# Patient Record
Sex: Female | Born: 1985 | Race: White | Hispanic: No | Marital: Married | Smoking: Former smoker
Health system: Southern US, Community
[De-identification: ages and names within clinical notes are randomized; demographics above are authoritative.]

## PROBLEM LIST (undated history)

## (undated) DIAGNOSIS — K219 Gastro-esophageal reflux disease without esophagitis: Secondary | ICD-10-CM

## (undated) DIAGNOSIS — J45909 Unspecified asthma, uncomplicated: Secondary | ICD-10-CM

## (undated) DIAGNOSIS — A6000 Herpesviral infection of urogenital system, unspecified: Secondary | ICD-10-CM

## (undated) DIAGNOSIS — E669 Obesity, unspecified: Secondary | ICD-10-CM

## (undated) DIAGNOSIS — F4310 Post-traumatic stress disorder, unspecified: Secondary | ICD-10-CM

## (undated) DIAGNOSIS — T753XXA Motion sickness, initial encounter: Secondary | ICD-10-CM

## (undated) DIAGNOSIS — F1910 Other psychoactive substance abuse, uncomplicated: Secondary | ICD-10-CM

## (undated) DIAGNOSIS — O24419 Gestational diabetes mellitus in pregnancy, unspecified control: Secondary | ICD-10-CM

## (undated) DIAGNOSIS — F419 Anxiety disorder, unspecified: Secondary | ICD-10-CM

## (undated) DIAGNOSIS — F329 Major depressive disorder, single episode, unspecified: Secondary | ICD-10-CM

## (undated) DIAGNOSIS — E739 Lactose intolerance, unspecified: Secondary | ICD-10-CM

## (undated) DIAGNOSIS — Z8742 Personal history of other diseases of the female genital tract: Secondary | ICD-10-CM

## (undated) DIAGNOSIS — F32A Depression, unspecified: Secondary | ICD-10-CM

## (undated) DIAGNOSIS — K644 Residual hemorrhoidal skin tags: Secondary | ICD-10-CM

## (undated) DIAGNOSIS — R0600 Dyspnea, unspecified: Secondary | ICD-10-CM

## (undated) HISTORY — DX: Post-traumatic stress disorder, unspecified: F43.10

## (undated) HISTORY — DX: Anxiety disorder, unspecified: F41.9

## (undated) HISTORY — DX: Depression, unspecified: F32.A

## (undated) HISTORY — DX: Herpesviral infection of urogenital system, unspecified: A60.00

## (undated) HISTORY — DX: Major depressive disorder, single episode, unspecified: F32.9

## (undated) HISTORY — DX: Obesity, unspecified: E66.9

## (undated) HISTORY — PX: NO PAST SURGERIES: SHX2092

## (undated) HISTORY — DX: Personal history of other diseases of the female genital tract: Z87.42

## (undated) HISTORY — DX: Unspecified asthma, uncomplicated: J45.909

## (undated) HISTORY — DX: Lactose intolerance, unspecified: E73.9

---

## 2004-11-12 ENCOUNTER — Ambulatory Visit: Payer: Self-pay | Admitting: Unknown Physician Specialty

## 2005-03-01 ENCOUNTER — Emergency Department: Payer: Self-pay | Admitting: Internal Medicine

## 2006-03-29 ENCOUNTER — Observation Stay: Payer: Self-pay | Admitting: Obstetrics & Gynecology

## 2006-05-14 ENCOUNTER — Inpatient Hospital Stay: Payer: Self-pay | Admitting: Obstetrics & Gynecology

## 2006-05-14 ENCOUNTER — Observation Stay: Payer: Self-pay | Admitting: Unknown Physician Specialty

## 2008-12-28 ENCOUNTER — Emergency Department: Payer: Self-pay | Admitting: Emergency Medicine

## 2013-05-28 ENCOUNTER — Emergency Department: Payer: Self-pay | Admitting: Emergency Medicine

## 2013-08-15 ENCOUNTER — Emergency Department: Payer: Self-pay | Admitting: Emergency Medicine

## 2013-08-15 LAB — COMPREHENSIVE METABOLIC PANEL
Alkaline Phosphatase: 97 U/L (ref 50–136)
Chloride: 106 mmol/L (ref 98–107)
Creatinine: 0.72 mg/dL (ref 0.60–1.30)
Glucose: 114 mg/dL — ABNORMAL HIGH (ref 65–99)
Osmolality: 272 (ref 275–301)
SGPT (ALT): 31 U/L (ref 12–78)
Sodium: 136 mmol/L (ref 136–145)
Total Protein: 8 g/dL (ref 6.4–8.2)

## 2013-08-15 LAB — CBC
HCT: 44.1 % (ref 35.0–47.0)
MCHC: 34.6 g/dL (ref 32.0–36.0)
Platelet: 269 10*3/uL (ref 150–440)
RBC: 5.2 10*6/uL (ref 3.80–5.20)
RDW: 13.2 % (ref 11.5–14.5)

## 2013-08-15 LAB — URINALYSIS, COMPLETE
Glucose,UR: NEGATIVE mg/dL (ref 0–75)
Ketone: NEGATIVE
Nitrite: NEGATIVE
Ph: 5 (ref 4.5–8.0)
Protein: NEGATIVE
RBC,UR: 4 /HPF (ref 0–5)
Specific Gravity: 1.024 (ref 1.003–1.030)
Squamous Epithelial: 4
WBC UR: 5 /HPF (ref 0–5)

## 2013-08-15 LAB — LIPASE, BLOOD: Lipase: 109 U/L (ref 73–393)

## 2014-02-22 IMAGING — CR RIGHT HIP - COMPLETE 2+ VIEW
1 series · 2 of 2 positions shown · non-contrast
Comparison: none

REASON FOR EXAM: trauma
COMMENTS:   May transport without cardiac monitor

PROCEDURE:     DXR - DXR HIP RIGHT COMPLETE  - May 28, 2013  [DATE]
RESULT:     AP and frog-leg lateral views of the right hip reveal no
evidence of fracture nor dislocation. No significant degenerative change is
demonstrated. The observed portions of the right hemipelvis appear normal.

[Series 2: t hip ap right · 0.14mm/px · 2 of 2 slices shown]
[im 1/2]
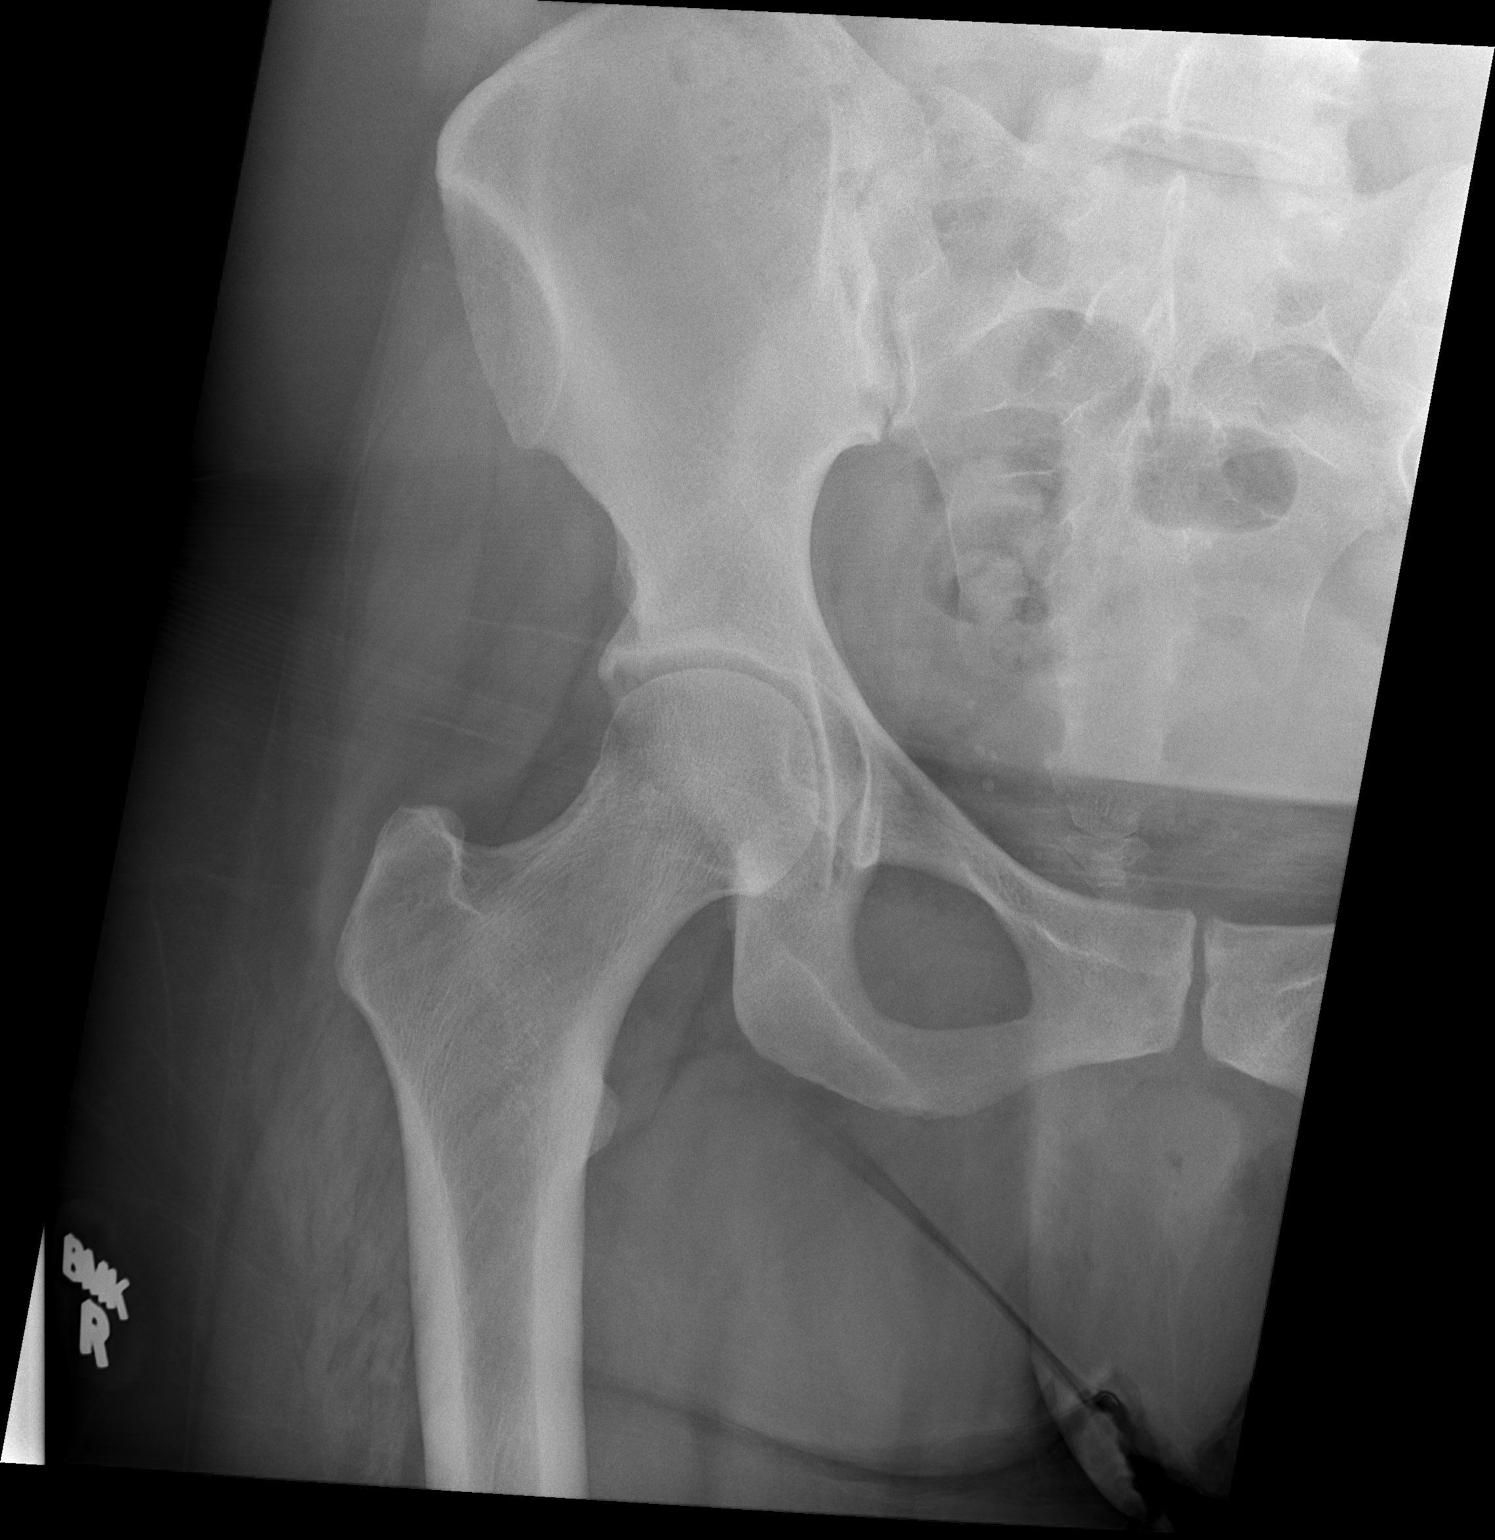
[im 2/2]
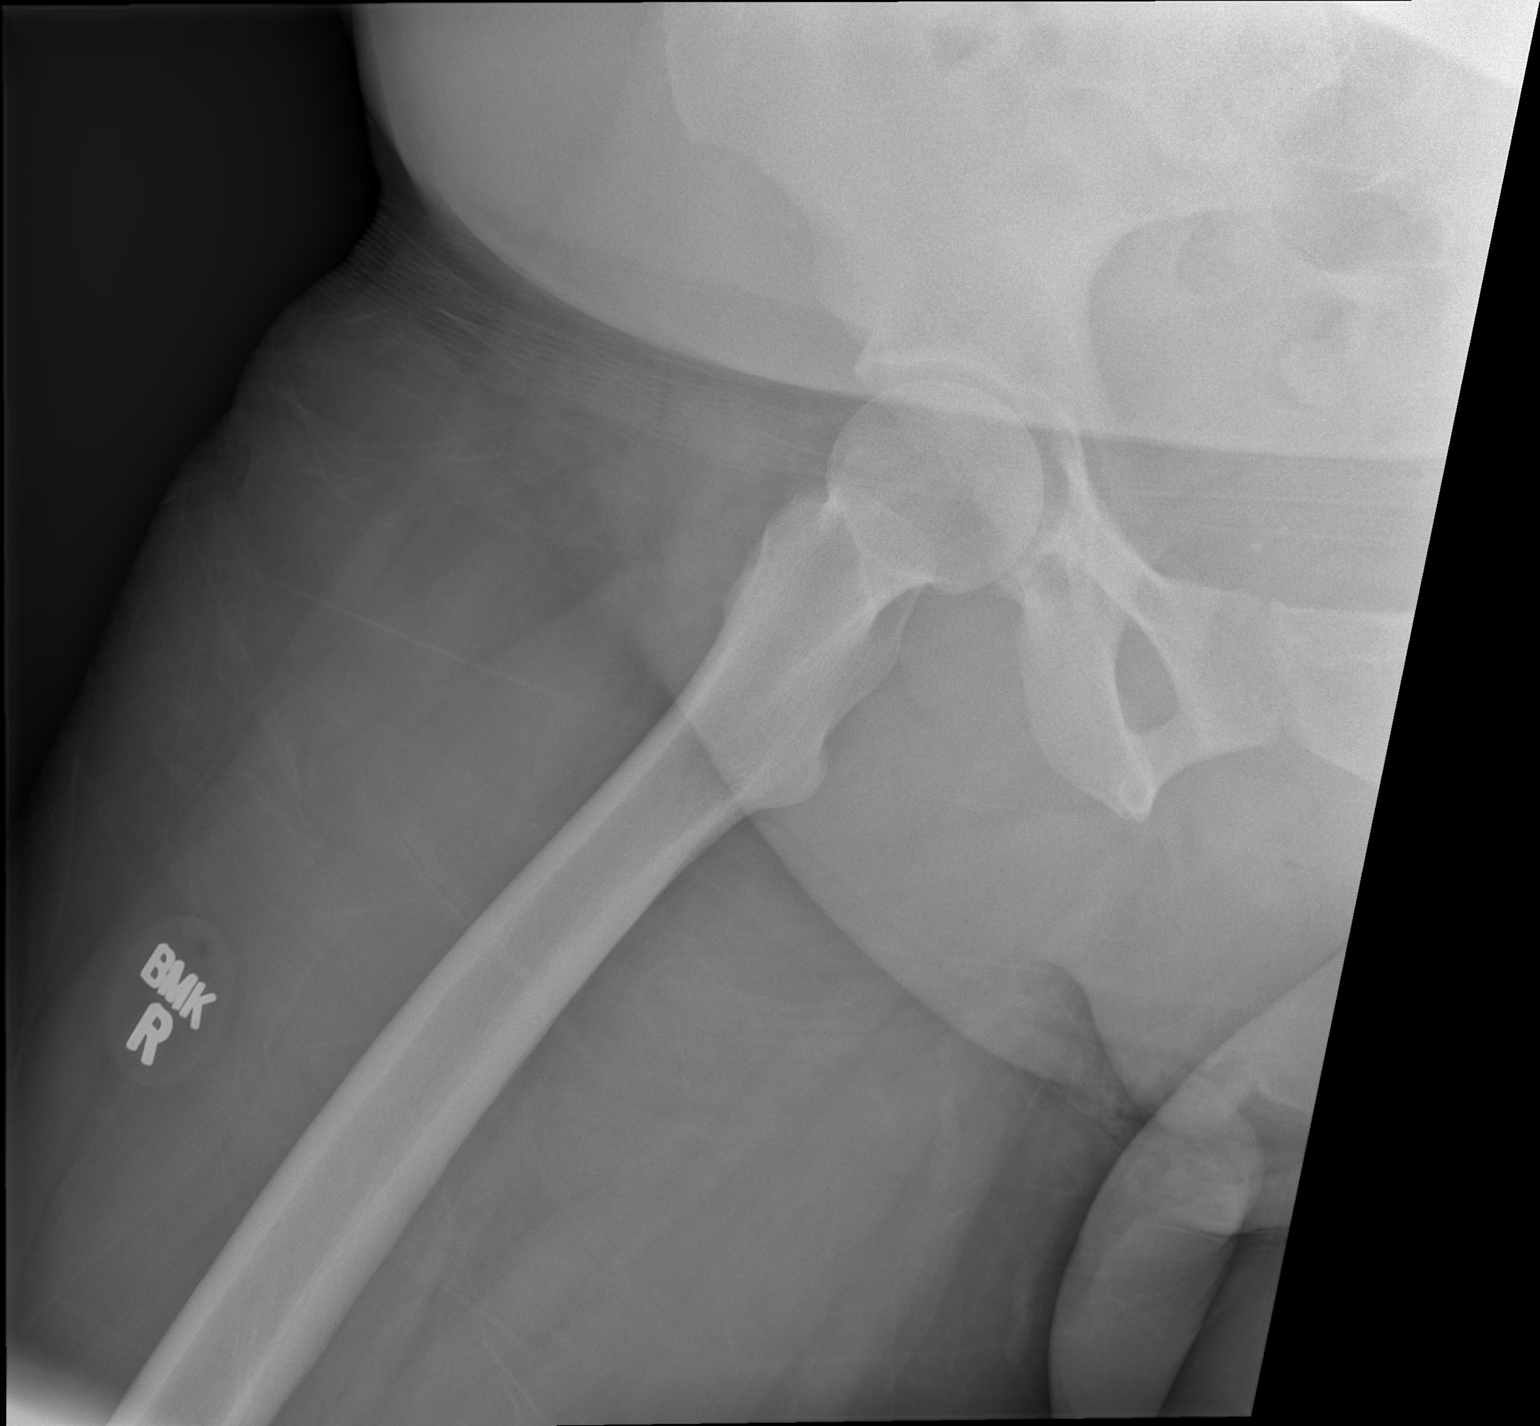

[2 of 2 positions shown; findings below may reference images not displayed]

IMPRESSION: There is no acute bony abnormality of the right hip.

[REDACTED]

## 2016-10-06 NOTE — L&D Delivery Note (Signed)
Delivery Note At 9:07 PM a viable female was delivered via Vaginal, Spontaneous Delivery (Presentation: ;  ).  APGAR: 8, 9; weight 8 lb 1.5 oz (3670 g).   Placenta status: spontaneous, intact.  Cord: 3VC, nuchal x 1  without complications: .  Cord pH: N/A  Anesthesia:  Epidural Episiotomy: None Lacerations: 1st degree Suture Repair: 3.0 monocryl Est. Blood Loss (mL):  300mL  Mom to postpartum.  Baby to Couplet care / Skin to Skin.  Carolyn Rosales 08/07/2017, 9:37 PM

## 2016-12-28 LAB — OB RESULTS CONSOLE TSH: TSH: 3.41

## 2016-12-28 LAB — OB RESULTS CONSOLE HEPATITIS B SURFACE ANTIGEN: Hepatitis B Surface Ag: NEGATIVE

## 2016-12-28 LAB — OB RESULTS CONSOLE GC/CHLAMYDIA
Chlamydia: NEGATIVE
GC PROBE AMP, GENITAL: NEGATIVE

## 2016-12-28 LAB — OB RESULTS CONSOLE ABO/RH: RH Type: POSITIVE

## 2016-12-28 LAB — OB RESULTS CONSOLE HIV ANTIBODY (ROUTINE TESTING): HIV: NONREACTIVE

## 2016-12-28 LAB — OB RESULTS CONSOLE RPR: RPR: NONREACTIVE

## 2017-03-20 ENCOUNTER — Telehealth: Payer: Self-pay | Admitting: Obstetrics & Gynecology

## 2017-03-20 NOTE — Telephone Encounter (Signed)
Called and left voicemail for patient to call back. We have received medical records from Budd PalmerBrandon M Lingenfelter PHD with Patient ob Records. Please schedule when patient returns call.

## 2017-03-23 NOTE — Telephone Encounter (Signed)
Left voicemail for pt to call back to being schedule

## 2017-04-09 ENCOUNTER — Other Ambulatory Visit: Payer: Self-pay | Admitting: Obstetrics and Gynecology

## 2017-04-09 ENCOUNTER — Ambulatory Visit (INDEPENDENT_AMBULATORY_CARE_PROVIDER_SITE_OTHER): Payer: Medicaid Other | Admitting: Certified Nurse Midwife

## 2017-04-09 ENCOUNTER — Encounter: Payer: Self-pay | Admitting: Certified Nurse Midwife

## 2017-04-09 VITALS — BP 122/62 | HR 96 | Ht 66.25 in | Wt 272.0 lb

## 2017-04-09 DIAGNOSIS — O0990 Supervision of high risk pregnancy, unspecified, unspecified trimester: Secondary | ICD-10-CM | POA: Insufficient documentation

## 2017-04-09 DIAGNOSIS — Z3A21 21 weeks gestation of pregnancy: Secondary | ICD-10-CM

## 2017-04-09 DIAGNOSIS — F419 Anxiety disorder, unspecified: Secondary | ICD-10-CM

## 2017-04-09 DIAGNOSIS — Z363 Encounter for antenatal screening for malformations: Secondary | ICD-10-CM

## 2017-04-09 DIAGNOSIS — F1290 Cannabis use, unspecified, uncomplicated: Secondary | ICD-10-CM

## 2017-04-09 DIAGNOSIS — O99342 Other mental disorders complicating pregnancy, second trimester: Secondary | ICD-10-CM

## 2017-04-09 DIAGNOSIS — O99210 Obesity complicating pregnancy, unspecified trimester: Secondary | ICD-10-CM | POA: Insufficient documentation

## 2017-04-09 DIAGNOSIS — F329 Major depressive disorder, single episode, unspecified: Secondary | ICD-10-CM

## 2017-04-09 DIAGNOSIS — F32A Depression, unspecified: Secondary | ICD-10-CM | POA: Insufficient documentation

## 2017-04-09 DIAGNOSIS — A6000 Herpesviral infection of urogenital system, unspecified: Secondary | ICD-10-CM

## 2017-04-09 NOTE — Progress Notes (Signed)
New Obstetric Patient H&P    Chief Complaint: "Transferring prenatal care from [place]". Moved home where family is located   History of Present Illness: Patient is a 31 y.o. G34P1001 White female, LMP 11/07/16 presents for a transfer in [facility] appointment.  Based on her  LMP, her EDD is  08/14/17 and her EGA is [redacted]w[redacted]d. She began her care in [place]. Her EDC was confirmed with a 6wk5d ultrasound (12/24/2016).  Her last pap smear was 12/24/2016 and was NIL with positive HRHPV. She had two prenatal appointments in [place] and was last seen in April. Her prenatal care has been remarkable for obesity ( starting BMI 40, and has gained 19#) and depression (scored 24 on her EPDS at [facility]-was prescribed Buspar and Prozac, but has not taken medication). She has moved back to be near family and reports that it has really made her feel better being back home. She has been taking Benadryl for allergies and sleep, omeprazole and Tums for reflux and Flintstone gummie vitamins. She had cell free DNA testing which was negative for aneuploidy (XX). Her urine drug screen was positive for THC, but she reports no longer using MJ. Was using daily prior to positive pregnancy test. Other labs: AB POS, AbSc neg, HIV neg, HCV neg, HBSAG neg, RPR neg. Her past medical history is remarkable for obesity, abnormal Pap smears, HSV II, stress induced asthma, and migraine headache.  Her prior pregnancies are notable for a SVD in 2007 delivering a 7#7oz female at [facility]. Her postpartum course was remarkable for a spinal headache which was treated with a blood patch.  Since her LMP, she admits to the use of tobacco products : no (former smoker) Illicit street drugs: yes MJ x 15 years She claims she has gained  19 pounds since the start of her pregnancy.  There are cats in the home in the home  no If yes NA She admits close contact with children on a regular basis  yes  She has had chicken pox in the past yes She has  had Tuberculosis exposures, symptoms, or previously tested positive for TB   no Current or past history of domestic violence. no  Genetic Screening/Teratology Counseling: (Includes patient, baby's father, or anyone in either family with:)   1. Patient's age >/= 19 at [facility]  no 2. Thalassemia ([place], [place], Mediterranean, or Asian background): MCV<80  no 3. Neural tube defect (meningomyelocele, spina bifida, anencephaly)  no 4. Congenital heart defect  no  5. Down syndrome  no 6. Tay-Sachs (Jewish, [place]  no 7. Canavan's Disease  no 8. Sickle cell disease or trait (African)  no  9. Hemophilia or other blood disorders  no  10. Muscular dystrophy  no  11. Cystic fibrosis  no  12. Huntington's Chorea  no  13. Mental retardation/autism  no 14. Other inherited genetic or chromosomal disorder  no 15. Maternal metabolic disorder (DM, PKU, etc)  no 16. Patient or FOB with a child with a birth defect not listed above no  16a. Patient or FOB with a birth defect themselves no 17. Recurrent pregnancy loss, or stillbirth  no  18. Any medications since LMP other than prenatal vitamins (include vitamins, supplements, OTC meds, drugs, alcohol)  Yes, marijuana, Benadryl, omeprazole, Flagyl, Tums 19. Any other genetic/environmental exposure to discuss  no  Infection History:   1. Lives with someone with TB or TB exposed  no  2. Patient or partner has  history of genital herpes  Yes, patient has a history of HSV II 3. Rash or viral illness since LMP  no 4. History of STI (GC, CT, HPV, syphilis, HIV)  Yes, Chlamydia with first pregnancy 5. History of recent travel :  no  Other pertinent information:  no     Review of Systems:10 point review of systems negative unless otherwise noted in HPI  Past Medical History:  Past Medical History:  Diagnosis Date  . Anxiety   . Asthma    as a child  . Depression   . Herpes genitalis   . History of abnormal cervical Pap smear   . Obesity   .  PTSD (post-traumatic stress disorder)     Past Surgical History:  Past Surgical History:  Procedure Laterality Date  . NO PAST SURGERIES      Gynecologic History: Patient's last menstrual period was 11/07/2016 (exact date).  Obstetric History: G2P1001  Family History:  Family History  Problem Relation Age of Onset  . Multiple sclerosis Mother   . Diabetes Mellitus II Mother   . Hypertension Mother   . Breast cancer Maternal Aunt   . Breast cancer Paternal Aunt   . Breast cancer Paternal Grandmother     Social History:  Social History   Social History  . Marital status: Married    Spouse name: N/A  . Number of children: 1  . Years of education: N/A   Occupational History  . Not on file.   Social History Main Topics  . Smoking status: Former Games developermoker  . Smokeless tobacco: Never Used  . Alcohol use No  . Drug use: No     Comment: History of daily marijuana use prior to pregnancy  . Sexual activity: Yes    Partners: Male    Birth control/ protection: None   Other Topics Concern  . Not on file   Social History Narrative  . No narrative on file    Allergies:  No Known Allergies  Medications: Tums, benadryl, omeprazole Physical Exam Vitals: BP 122/62   Pulse 96   Ht 5' 6.25" (1.683 m)   Wt 272 lb (123.4 kg)   LMP 11/07/2016 (Exact Date)   BMI 43.57 kg/m   General: gravid, WF in NAD Abdomen: NABS, soft, non-tender. FHT 146 BPM Extremities: no edema, erythema, or tenderness Neurologic: Grossly intact Psychiatric: mood appropriate, affect full   Assessment: 31 y.o. 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 2) Patient encouraged not to smoke and not to use marijuana 3) Discontinue the use of all non-medicinal drugs and chemicals.  4) Take prenatal vitamins daily. Marland Kitchen. 5) Hospital and practice style discussed with cross coverage system.  6)  Anatomy scan scheduled as well as follow up. 7) Discussed treatment of depression and anxiety during pregnancy. Insists that she is feeling better and does not think she needs medication at this time. 8) Plan Valtrex PPX at 36 weeks 9) Growth scans q month starting at 28 weeks, NST/AFIs weekly beginning at 36 weeks  Farrel Connersolleen Izekiel Flegel, CNM

## 2017-04-09 NOTE — Progress Notes (Signed)
[facility] States she has not been seen by OB since May.

## 2017-04-13 ENCOUNTER — Ambulatory Visit (INDEPENDENT_AMBULATORY_CARE_PROVIDER_SITE_OTHER): Payer: Medicaid Other

## 2017-04-13 ENCOUNTER — Ambulatory Visit (INDEPENDENT_AMBULATORY_CARE_PROVIDER_SITE_OTHER): Payer: Medicaid Other | Admitting: Obstetrics and Gynecology

## 2017-04-13 ENCOUNTER — Encounter: Payer: Self-pay | Admitting: Certified Nurse Midwife

## 2017-04-13 VITALS — BP 136/70 | Wt 272.0 lb

## 2017-04-13 DIAGNOSIS — O0990 Supervision of high risk pregnancy, unspecified, unspecified trimester: Secondary | ICD-10-CM

## 2017-04-13 DIAGNOSIS — Z363 Encounter for antenatal screening for malformations: Secondary | ICD-10-CM

## 2017-04-13 DIAGNOSIS — Z8742 Personal history of other diseases of the female genital tract: Secondary | ICD-10-CM | POA: Insufficient documentation

## 2017-04-13 DIAGNOSIS — Z3A22 22 weeks gestation of pregnancy: Secondary | ICD-10-CM

## 2017-04-13 DIAGNOSIS — A6000 Herpesviral infection of urogenital system, unspecified: Secondary | ICD-10-CM | POA: Insufficient documentation

## 2017-04-13 MED ORDER — RANITIDINE HCL 150 MG PO TABS: 150.0000 mg | ORAL_TABLET | Freq: Every day | ORAL | 6 refills | Status: DC

## 2017-04-13 NOTE — Progress Notes (Signed)
Pos PNVs. No LOF. Pt has had a little spotting but usually after BMs and has hemorrhoids. Pt doesn't know if vaginal. No cramping/contrxns. Anat scan incomplete today. F/u in 4 wks with ROB. Pt with GERD--taking Tums/can't afford OTC zantac. Rx zantac eRxd. Breast/bottle, no BC after delivery--wants another child.

## 2017-04-13 NOTE — Progress Notes (Signed)
Anatomy scan/It is a girl No urine sample provided Medications were not called in on Thursday

## 2017-04-16 LAB — URINE DRUG PANEL 7
Amphetamines, Urine: NEGATIVE ng/mL
BENZODIAZEPINE QUANT UR: NEGATIVE ng/mL
Barbiturate Quant, Ur: NEGATIVE ng/mL
Cannabinoid Quant, Ur: POSITIVE — AB
Cocaine (Metab.): NEGATIVE ng/mL
Opiate Quant, Ur: NEGATIVE ng/mL
PCP QUANT UR: NEGATIVE ng/mL

## 2017-05-11 ENCOUNTER — Encounter: Payer: Medicaid Other | Admitting: Obstetrics and Gynecology

## 2017-05-11 ENCOUNTER — Other Ambulatory Visit: Payer: Medicaid Other

## 2017-05-13 ENCOUNTER — Ambulatory Visit (INDEPENDENT_AMBULATORY_CARE_PROVIDER_SITE_OTHER): Payer: Medicaid Other | Admitting: Obstetrics and Gynecology

## 2017-05-13 ENCOUNTER — Encounter: Payer: Self-pay | Admitting: Obstetrics and Gynecology

## 2017-05-13 ENCOUNTER — Ambulatory Visit (INDEPENDENT_AMBULATORY_CARE_PROVIDER_SITE_OTHER): Payer: Medicaid Other

## 2017-05-13 VITALS — BP 136/78 | Wt 280.0 lb

## 2017-05-13 DIAGNOSIS — O99320 Drug use complicating pregnancy, unspecified trimester: Secondary | ICD-10-CM | POA: Insufficient documentation

## 2017-05-13 DIAGNOSIS — O99213 Obesity complicating pregnancy, third trimester: Secondary | ICD-10-CM

## 2017-05-13 DIAGNOSIS — O0990 Supervision of high risk pregnancy, unspecified, unspecified trimester: Secondary | ICD-10-CM

## 2017-05-13 DIAGNOSIS — F1290 Cannabis use, unspecified, uncomplicated: Secondary | ICD-10-CM

## 2017-05-13 DIAGNOSIS — A6000 Herpesviral infection of urogenital system, unspecified: Secondary | ICD-10-CM

## 2017-05-13 DIAGNOSIS — O99342 Other mental disorders complicating pregnancy, second trimester: Secondary | ICD-10-CM

## 2017-05-13 DIAGNOSIS — O99210 Obesity complicating pregnancy, unspecified trimester: Secondary | ICD-10-CM

## 2017-05-13 DIAGNOSIS — Z3A26 26 weeks gestation of pregnancy: Secondary | ICD-10-CM

## 2017-05-13 DIAGNOSIS — Z363 Encounter for antenatal screening for malformations: Secondary | ICD-10-CM

## 2017-05-13 DIAGNOSIS — F419 Anxiety disorder, unspecified: Secondary | ICD-10-CM

## 2017-05-13 DIAGNOSIS — Z113 Encounter for screening for infections with a predominantly sexual mode of transmission: Secondary | ICD-10-CM

## 2017-05-13 DIAGNOSIS — F329 Major depressive disorder, single episode, unspecified: Secondary | ICD-10-CM

## 2017-05-13 NOTE — Progress Notes (Signed)
ROB  °Anatomy scan °

## 2017-05-13 NOTE — Progress Notes (Signed)
Routine Prenatal Care Visit  Subjective  Fetal Movement? yes Contractions? no Leaking Fluid? no Vaginal Bleeding? no  Objective  Blood pressure 136/78, weight 280 lb (127 kg), last menstrual period 11/07/2016. Pregravid weight 253 lb (114.8 kg),  Urine dipstick shows negative for all components.  General: NAD Pumonary: no increased work of breathing Abdomen: gravid, non-tender, fundal height 28, fetal heart tones 140BPM Extremities: no edema Psychiatric: mood appropriate, affect full   Assessment   31 y.o. G2P1001 at [redacted]w[redacted]d by  08/14/2017, by Last Menstrual Period presenting for routine prenatal visit  pregnancy #2 Problems (from 04/09/17 to 05/13/17)    Problem Noted Resolved   Substance abuse affecting pregnancy, antepartum 05/13/2017 by Vena [place], MD No   Herpes genitalis 04/13/2017 by Farrel Conners, CNM No   Overview Addendum 05/13/2017  9:11 PM by Vena [place], MD    [ ]  Start Valtrex 500 mgm daily at 36 week      Maternal obesity, antepartum 04/09/2017 by Farrel Conners, CNM No   Overview Addendum 05/13/2017  9:01 PM by Vena [place], MD     BMI >=40 [ ]  early 1h gtt - not done [ x] u/s for dating   [X]  Growth u/s 28 [ ] , 32 [ ] , 36 weeks [ ]  [ ]  NST/AFI weekly 36+ weeks (36[] , 37[] , 38[] , 39[] , 40[] ) [ ]  IOL by 41 weeks (scheduled, prn [] )       Supervision of high risk pregnancy, antepartum 04/09/2017 by Farrel Conners, CNM No   Overview Addendum 05/13/2017  9:10 PM by Vena [place], MD    [facility] (transferred from [place]) Prenatal Labs  Dating LMP = 6w [place] Blood type: AB/Positive/-- (03/25 0000)   Genetic Screen NIPS: Normal XX Antibody:  neg  Anatomic [place] Completed 05/13/17 Rubella:   I Varicella:   GTT Early:               Third trimester:  RPR: Nonreactive (03/25 0000)   Rhogam  HBsAg: Negative (03/25 0000)   TDaP vaccine                       Flu Shot: HIV: Non-reactive (03/25 0000)   Baby Food Breast/bottle                      GBS:   Contraception none Pap:NIL with POS HRHPV  CBB     CS/VBAC    Support Person                   Plan   Problem List Items Addressed This Visit      Genitourinary   Herpes genitalis     Other   Depression   Relevant Orders   28 Week RH+Panel   Anxiety   Relevant Orders   28 Week RH+Panel   Maternal obesity, antepartum   Supervision of high risk pregnancy, antepartum - Primary   Relevant Orders   28 Week RH+Panel   Marijuana use   Substance abuse affecting pregnancy, antepartum   Relevant Orders   28 Week RH+Panel    Other Visit Diagnoses    [redacted] weeks gestation of pregnancy       Relevant Orders   28 Week RH+Panel   Screen for STD (sexually transmitted disease)       Relevant Orders   28 Week RH+Panel   Maternal morbid obesity, antepartum, third trimester (HCC)       Relevant Orders  US OB Follow Up     -28 week labs and growth scan next visit - anatomy scan complete today

## 2017-05-25 ENCOUNTER — Emergency Department
Admission: EM | Admit: 2017-05-25 | Discharge: 2017-05-25 | Disposition: A | Discharge status: Home / Self Care | Payer: Medicaid Other | Attending: Emergency Medicine | Admitting: Emergency Medicine

## 2017-05-25 DIAGNOSIS — Z87891 Personal history of nicotine dependence: Secondary | ICD-10-CM | POA: Diagnosis not present

## 2017-05-25 DIAGNOSIS — Z3A29 29 weeks gestation of pregnancy: Secondary | ICD-10-CM | POA: Diagnosis not present

## 2017-05-25 DIAGNOSIS — K649 Unspecified hemorrhoids: Secondary | ICD-10-CM | POA: Diagnosis present

## 2017-05-25 DIAGNOSIS — Z79899 Other long term (current) drug therapy: Secondary | ICD-10-CM | POA: Insufficient documentation

## 2017-05-25 DIAGNOSIS — O98319 Other infections with a predominantly sexual mode of transmission complicating pregnancy, unspecified trimester: Secondary | ICD-10-CM | POA: Diagnosis not present

## 2017-05-25 DIAGNOSIS — N9489 Other specified conditions associated with female genital organs and menstrual cycle: Secondary | ICD-10-CM | POA: Diagnosis not present

## 2017-05-25 LAB — COMPREHENSIVE METABOLIC PANEL
ALK PHOS: 80 U/L (ref 38–126)
ALT: 10 U/L — AB (ref 14–54)
AST: 21 U/L (ref 15–41)
Albumin: 3 g/dL — ABNORMAL LOW (ref 3.5–5.0)
Anion gap: 9 (ref 5–15)
BILIRUBIN TOTAL: 0.2 mg/dL — AB (ref 0.3–1.2)
BUN: 8 mg/dL (ref 6–20)
CALCIUM: 9.1 mg/dL (ref 8.9–10.3)
CHLORIDE: 106 mmol/L (ref 101–111)
CO2: 21 mmol/L — ABNORMAL LOW (ref 22–32)
CREATININE: 0.5 mg/dL (ref 0.44–1.00)
GFR calc Af Amer: >60 mL/min (ref >60)
Glucose, Bld: 128 mg/dL — ABNORMAL HIGH (ref 65–99)
Potassium: 3.7 mmol/L (ref 3.5–5.1)
Sodium: 136 mmol/L (ref 135–145)
Total Protein: 6.6 g/dL (ref 6.5–8.1)

## 2017-05-25 LAB — CBC
HCT: 32.4 % — ABNORMAL LOW (ref 35.0–47.0)
Hemoglobin: 10.9 g/dL — ABNORMAL LOW (ref 12.0–16.0)
MCH: 27.9 pg (ref 26.0–34.0)
MCHC: 33.6 g/dL (ref 32.0–36.0)
MCV: 83 fL (ref 80.0–100.0)
PLATELETS: 249 10*3/uL (ref 150–440)
RBC: 3.9 MIL/uL (ref 3.80–5.20)
RDW: 14.4 % (ref 11.5–14.5)
WBC: 14.2 10*3/uL — AB (ref 3.6–11.0)

## 2017-05-25 LAB — HCG, QUANTITATIVE, PREGNANCY: hCG, Beta Chain, Quant, S: 6478 m[IU]/mL — ABNORMAL HIGH (ref <5)

## 2017-05-25 MED ORDER — HYDROCORTISONE 2.5 % RE CREA: TOPICAL_CREAM | RECTAL | 1 refills | Status: DC

## 2017-05-25 MED ORDER — LIDOCAINE VISCOUS 2 % MT SOLN: OROMUCOSAL | 0 refills | Status: DC

## 2017-05-25 NOTE — ED Triage Notes (Signed)
Pt presents to ED for hemorrhoids. Pt is [redacted] weeks pregnant, due Nov 9th. Pt states hemorrhoid has come and gone. Pt states she was at doctor and was told purple/bruising around hemorrhoid. States hemorrhoid is so big doctor wants it cut. Pt called [facility] and said they can't do that and told to come here. Sees [facility] for OB care, feeling baby kick, no vaginal bleeding. Denies rectal bleeding recently. Pt states pain while lying or sitting.

## 2017-05-25 NOTE — ED Provider Notes (Signed)
[facility] Emergency Department Provider Note  ____________________________________________   I have reviewed the triage vital signs and the nursing notes.   HISTORY  Chief Complaint Hemorrhoids    HPI Carolyn Rosales is a 31 y.o. female Who presents today complaining of a hemorrhoid. She has had one for 3 days. She's had them off and on her whole life she states. She states that she has been using suppositories and has been getting better but she went to the health Department and they're worried about it and sent her here. She has had no fever, the pain is actually improving over the last few days somewhat. She states it is uncomfortable however to have bowel movements. She has been somewhat constipated. Her OB/GYN was contacted, and they indicated the patient should follow up with PCP however patient did come here she could not get in to see her PCP.  Her strong preference is that nothing be done. She denies ongoing bleeding. There was some scant hematochezia in the past. She is [redacted] weeks pregnant. She denies any abdominal pain, vaginal bleeding, or other pregnancy related complaints.  Past Medical History:  Diagnosis Date  . Anxiety   . Asthma    as a child  . Depression   . Herpes genitalis   . History of abnormal cervical Pap smear   . Obesity   . PTSD (post-traumatic stress disorder)     Patient Active Problem List   Diagnosis Date Noted  . Substance abuse affecting pregnancy, antepartum 05/13/2017  . Herpes genitalis 04/13/2017  . History of abnormal cervical Pap smear 04/13/2017  . Depression 04/09/2017  . Anxiety 04/09/2017  . Maternal obesity, antepartum 04/09/2017  . Supervision of high risk pregnancy, antepartum 04/09/2017  . Marijuana use 04/09/2017    Past Surgical History:  Procedure Laterality Date  . NO PAST SURGERIES      Prior to Admission medications   Medication Sig Start Date End Date Taking? Authorizing  Provider  omeprazole (PRILOSEC) 20 MG capsule TK 1 C PO ONCE D 05/01/17   [provider]  Pediatric Multivit-Minerals-C (FLINTSTONES GUMMIES COMPLETE PO) Take 1 tablet by mouth daily.    [provider]    Allergies Patient has no known allergies.  Family History  Problem Relation Age of Onset  . Multiple sclerosis Mother   . Diabetes Mellitus II Mother   . Hypertension Mother   . Breast cancer Maternal Aunt   . Breast cancer Paternal Aunt   . Breast cancer Paternal Grandmother     Social History Social History  Substance Use Topics  . Smoking status: Former Games developer  . Smokeless tobacco: Never Used  . Alcohol use No    Review of Systems Constitutional: No fever/chills Eyes: No visual changes. ENT: No sore throat. No stiff neck no neck pain Cardiovascular: Denies chest pain. Respiratory: Denies shortness of breath. Gastrointestinal:   no vomiting.  No diarrhea.  No constipation. Genitourinary: Negative for dysuria. Musculoskeletal: Negative lower extremity swelling Skin: Negative for rash. Neurological: Negative for severe headaches, focal weakness or numbness.   ____________________________________________   PHYSICAL EXAM:  VITAL SIGNS: ED Triage Vitals  Enc Vitals Group     BP 05/25/17 1313 125/75     Pulse Rate 05/25/17 1313 (!) 106     Resp 05/25/17 1313 20     Temp 05/25/17 1313 98 F (36.7 C)     Temp Source 05/25/17 1313 Oral     SpO2 05/25/17 1313 98 %  Weight 05/25/17 1314 287 lb (130.2 kg)     Height 05/25/17 1314 5\' 6"  (1.676 m)     Head Circumference --      Peak Flow --      Pain Score 05/25/17 1313 4     Pain Loc --      Pain Edu? --      Excl. in GC? --     Constitutional: Alert and oriented. Well appearing and in no acute distress. Eyes: Conjunctivae are normal Head: Atraumatic HEENT: No congestion/rhinnorhea. Mucous membranes are moist.  Oropharynx non-erythematous Neck:   Nontender with no meningismus, no masses,  no stridor Cardiovascular: Normal rate, regular rhythm. Grossly normal heart sounds.  Good peripheral circulation. Respiratory: Normal respiratory effort.  No retractions. Lungs CTAB. Abdominal: Soft and nontender. No distention. No guarding no rebound gravid uterus palpated no tendernessBack:  There is no focal tenderness or step off.  there is no midline tenderness there are no lesions noted. there is no CVA tenderness Rectal: Female nurse, Maxine Glenn, present, there is a hemorrhoid. It does not appear to be inflamed significantly more than discussed trichomonas not red, there is no thrombotic area and there is no surrounding cellulitis or evidence of Abscess  Musculoskeletal: No lower extremity tenderness, no upper extremity tenderness. No joint effusions, no DVT signs strong distal pulses no edema Neurologic:  Normal speech and language. No gross focal neurologic deficits are appreciated.  Skin:  Skin is warm, dry and intact. No rash noted. Psychiatric: Mood and affect are normal. Speech and behavior are normal.  ____________________________________________   LABS (all labs ordered are listed, but only abnormal results are displayed)  Labs Reviewed  COMPREHENSIVE METABOLIC PANEL - Abnormal; Notable for the following:       Result Value   CO2 21 (*)    Glucose, Bld 128 (*)    Albumin 3.0 (*)    ALT 10 (*)    Total Bilirubin 0.2 (*)    All other components within normal limits  CBC - Abnormal; Notable for the following:    WBC 14.2 (*)    Hemoglobin 10.9 (*)    HCT 32.4 (*)    All other components within normal limits  HCG, QUANTITATIVE, PREGNANCY - Abnormal; Notable for the following:    hCG, Beta Chain, Quant, S 6,478 (*)    All other components within normal limits  POC OCCULT BLOOD, ED   ____________________________________________  EKG  I personally interpreted any EKGs ordered by me or triage  ____________________________________________  RADIOLOGY  I reviewed any  imaging ordered by me or triage that were performed during my shift and, if possible, patient and/or family made aware of any abnormal findings. ____________________________________________   PROCEDURES  Procedure(s) performed: None  Procedures  Critical Care performed: None  ____________________________________________   INITIAL IMPRESSION / ASSESSMENT AND PLAN / ED COURSE  Pertinent labs & imaging results that were available during my care of the patient were reviewed by me and considered in my medical decision making (see chart for details).  Patient here with a hemorrhoid. It is uncomplicated. I did talk to Dr. [facility] and they recommend sits baths, Anusol and possibly 2% lidocaine 3 times a day when necessary for pain control, high-fiber diet, MiraLAX and they will follow closely.No indication for acute intervention needed. There isno evidence of abscess or "bruising" or other pathology noted at this time. Return precautions and follow-up given and understood.    ____________________________________________   FINAL CLINICAL IMPRESSION(S) / ED  DIAGNOSES  Final diagnoses:  None      This chart was dictated using voice recognition software.  Despite best efforts to proofread,  errors can occur which can change meaning.      Jeanmarie Plant, MD 05/25/17 [phone]

## 2017-05-28 ENCOUNTER — Ambulatory Visit (INDEPENDENT_AMBULATORY_CARE_PROVIDER_SITE_OTHER): Payer: Medicaid Other

## 2017-05-28 ENCOUNTER — Encounter: Payer: Self-pay | Admitting: Advanced Practice Midwife

## 2017-05-28 ENCOUNTER — Other Ambulatory Visit: Payer: Medicaid Other

## 2017-05-28 ENCOUNTER — Ambulatory Visit (INDEPENDENT_AMBULATORY_CARE_PROVIDER_SITE_OTHER): Payer: Medicaid Other | Admitting: Advanced Practice Midwife

## 2017-05-28 VITALS — BP 140/84 | Wt 282.0 lb

## 2017-05-28 DIAGNOSIS — Z3A28 28 weeks gestation of pregnancy: Secondary | ICD-10-CM

## 2017-05-28 DIAGNOSIS — O99213 Obesity complicating pregnancy, third trimester: Secondary | ICD-10-CM | POA: Diagnosis not present

## 2017-05-28 DIAGNOSIS — O0990 Supervision of high risk pregnancy, unspecified, unspecified trimester: Secondary | ICD-10-CM

## 2017-05-28 DIAGNOSIS — O99342 Other mental disorders complicating pregnancy, second trimester: Secondary | ICD-10-CM

## 2017-05-28 DIAGNOSIS — Z113 Encounter for screening for infections with a predominantly sexual mode of transmission: Secondary | ICD-10-CM

## 2017-05-28 DIAGNOSIS — F329 Major depressive disorder, single episode, unspecified: Secondary | ICD-10-CM

## 2017-05-28 DIAGNOSIS — Z3A26 26 weeks gestation of pregnancy: Secondary | ICD-10-CM

## 2017-05-28 DIAGNOSIS — Z362 Encounter for other antenatal screening follow-up: Secondary | ICD-10-CM | POA: Diagnosis not present

## 2017-05-28 DIAGNOSIS — A601 Herpesviral infection of perianal skin and rectum: Secondary | ICD-10-CM

## 2017-05-28 DIAGNOSIS — F419 Anxiety disorder, unspecified: Secondary | ICD-10-CM

## 2017-05-28 DIAGNOSIS — O99320 Drug use complicating pregnancy, unspecified trimester: Secondary | ICD-10-CM

## 2017-05-28 MED ORDER — VALACYCLOVIR HCL 500 MG PO TABS: 500.0000 mg | ORAL_TABLET | Freq: Two times a day (BID) | ORAL | 1 refills | Status: AC

## 2017-05-28 NOTE — Progress Notes (Signed)
Patient has complaint of increased pain with hemorrhoids and was diagnosed in the last week with genital HSV. She has had ongoing problems with hemorrhoids but has never had this much pain. She is unable to sit for any length of time. She was seen in the ER for hemorrhoids and then at another clinic for the worsening pain. She received a call yesterday with the positive HSV result. On exam there is a cluster of hemorrhoidal tissue at the anus. There is an open lesion on the hemorrhoid that is extremely tender to touch. Her partner is with her today and states he had a negative HSV lab a year ago. Discussion today of initial, ongoing, and suppressive treatment and retesting of partner. Rx for valtrex sent today. 28 week labs today. ROB in 2 wks.

## 2017-05-28 NOTE — Patient Instructions (Signed)
Genital Herpes Genital herpes is a common sexually transmitted infection (STI) that is caused by a virus. The virus spreads from person to person through sexual contact. Infection can cause itching, blisters, and sores around the genitals or rectum. Symptoms may last several days and then go away This is called an outbreak. However, the virus remains in your body, so you may have more outbreaks in the future. The time between outbreaks varies and can be months or years. Genital herpes affects men and women. It is particularly concerning for pregnant women because the virus can be passed to the baby during delivery and can cause serious problems. Genital herpes is also a concern for people who have a weak disease-fighting (immune) system. What are the causes? This condition is caused by the herpes simplex virus (HSV) type 1 or type 2. The virus may spread through:  Sexual contact with an infected person, including vaginal, anal, and oral sex.  Contact with fluid from a herpes sore.  The skin. This means that you can get herpes from an infected partner even if he or she does not have a visible sore or does not know that he or she is infected. What increases the risk? You are more likely to develop this condition if:  You have sex with many partners.  You do not use latex condoms during sex. What are the signs or symptoms? Most people do not have symptoms (asymptomatic) or have mild symptoms that may be mistaken for other skin problems. Symptoms may include:  Small red bumps near the genitals, rectum, or mouth. These bumps turn into blisters and then turn into sores.  Flu-like symptoms, including:  Fever.  Body aches.  Swollen lymph nodes.  Headache.  Painful urination.  Pain and itching in the genital area or rectal area.  Vaginal discharge.  Tingling or shooting pain in the legs and buttocks. Generally, symptoms are more severe and last longer during the first (primary)  outbreak. Flu-like symptoms are also more common during the primary outbreak. How is this diagnosed? Genital herpes may be diagnosed based on:  A physical exam.  Your medical history.  Blood tests.  A test of a fluid sample (culture) from an open sore. How is this treated? There is no cure for this condition, but treatment with antiviral medicines that are taken by mouth (orally) can do the following:  Speed up healing and relieve symptoms.  Help to reduce the spread of the virus to sexual partners.  Limit the chance of future outbreaks, or make future outbreaks shorter.  Lessen symptoms of future outbreaks. Your health care provider may also recommend pain relief medicines, such as aspirin or ibuprofen. Follow these instructions at home: Sexual activity   Do not have sexual contact during active outbreaks.  Practice safe sex. Latex condoms and female condoms may help prevent the spread of the herpes virus. General instructions   Keep the affected areas dry and clean.  Take over-the-counter and prescription medicines only as told by your health care provider.  Avoid rubbing or touching blisters and sores. If you do touch blisters or sores:  Wash your hands thoroughly with soap and water.  Do not touch your eyes afterward.  To help relieve pain or itching, you may take the following actions as directed by your health care provider:  Apply a cold, wet cloth (cold compress) to affected areas 4-6 times a day.  Apply a substance that protects your skin and reduces bleeding (astringent).  Apply a   gel that helps relieve pain around sores (lidocaine gel).  Take a warm, shallow bath that cleans the genital area (sitz bath).  Keep all follow-up visits as told by your health care provider. This is important. How is this prevented?  Use condoms. Although anyone can get genital herpes during sexual contact, even with the use of a condom, a condom can provide some  protection.  Avoid having multiple sexual partners.  Talk with your sexual partner about any symptoms either of you may have. Also, talk with your partner about any history of STIs.  Get tested for STIs before you have sex. Ask your partner to do the same.  Do not have sexual contact if you have symptoms of genital herpes. Contact a health care provider if:  Your symptoms are not improving with medicine.  Your symptoms return.  You have new symptoms.  You have a fever.  You have abdominal pain.  You have redness, swelling, or pain in your eye.  You notice new sores on other parts of your body.  You are a woman and experience bleeding between menstrual periods.  You have had herpes and you become pregnant or plan to become pregnant. Summary  Genital herpes is a common sexually transmitted infection (STI) that is caused by the herpes simplex virus (HSV) type 1 or type 2.  These viruses are most often spread through sexual contact with an infected person.  You are more likely to develop this condition if you have sex with many partners or you have unprotected sex.  Most people do not have symptoms (asymptomatic) or have mild symptoms that may be mistaken for other skin problems. Symptoms occur as outbreaks that may happen months or years apart.  There is no cure for this condition, but treatment with oral antiviral medicines can reduce symptoms, reduce the chance of spreading the virus to a partner, prevent future outbreaks, or shorten future outbreaks. This information is not intended to replace advice given to you by your health care provider. Make sure you discuss any questions you have with your health care provider. Document Released: 09/19/2000 Document Revised: 08/22/2016 Document Reviewed: 08/22/2016 Elsevier Interactive Patient Education  2017 [employer]  

## 2017-05-28 NOTE — Progress Notes (Signed)
Hemorrhoids (went to ER) (pt tearful) 28 week labs Growth scan

## 2017-05-29 ENCOUNTER — Other Ambulatory Visit: Payer: Self-pay | Admitting: Obstetrics and Gynecology

## 2017-05-29 ENCOUNTER — Encounter: Payer: Self-pay | Admitting: Obstetrics and Gynecology

## 2017-05-29 DIAGNOSIS — Z3A28 28 weeks gestation of pregnancy: Secondary | ICD-10-CM

## 2017-05-29 DIAGNOSIS — R7309 Other abnormal glucose: Secondary | ICD-10-CM

## 2017-05-29 DIAGNOSIS — O99019 Anemia complicating pregnancy, unspecified trimester: Secondary | ICD-10-CM | POA: Insufficient documentation

## 2017-05-29 LAB — 28 WEEK RH+PANEL
Basophils Absolute: 0 10*3/uL (ref 0.0–0.2)
Basos: 0 %
EOS (ABSOLUTE): 0.1 10*3/uL (ref 0.0–0.4)
EOS: 1 %
Gestational Diabetes Screen: 169 mg/dL — ABNORMAL HIGH (ref 65–139)
HEMOGLOBIN: 10.4 g/dL — AB (ref 11.1–15.9)
HIV SCREEN 4TH GENERATION: NONREACTIVE
Hematocrit: 33.4 % — ABNORMAL LOW (ref 34.0–46.6)
IMMATURE GRANS (ABS): 0 10*3/uL (ref 0.0–0.1)
Immature Granulocytes: 0 %
LYMPHS ABS: 1.5 10*3/uL (ref 0.7–3.1)
LYMPHS: 12 %
MCH: 26.9 pg (ref 26.6–33.0)
MCHC: 31.1 g/dL — ABNORMAL LOW (ref 31.5–35.7)
MCV: 86 fL (ref 79–97)
MONOCYTES: 3 %
Monocytes Absolute: 0.4 10*3/uL (ref 0.1–0.9)
NEUTROS ABS: 10.5 10*3/uL — AB (ref 1.4–7.0)
Neutrophils: 84 %
Platelets: 273 10*3/uL (ref 150–379)
RBC: 3.87 x10E6/uL (ref 3.77–5.28)
RDW: 15 % (ref 12.3–15.4)
RPR Ser Ql: NONREACTIVE
WBC: 12.6 10*3/uL — AB (ref 3.4–10.8)

## 2017-05-29 MED ORDER — FERROUS SULFATE 325 (65 FE) MG PO TABS: 325.0000 mg | ORAL_TABLET | Freq: Every day | ORAL | 1 refills | Status: DC

## 2017-06-01 ENCOUNTER — Telehealth: Payer: Self-pay | Admitting: Obstetrics and Gynecology

## 2017-06-01 NOTE — Telephone Encounter (Signed)
-----   Message from Vena Austria, MD sent at 05/29/2017 12:36 PM EDT ----- Regarding: Schedule 3-hr OGTT Needs 3-hr glucose test scheduled in the next week.  Patient aware of results and expecting call

## 2017-06-01 NOTE — Telephone Encounter (Signed)
Called and left voicemail for patient to call to be schedule. °

## 2017-06-12 ENCOUNTER — Other Ambulatory Visit: Payer: Medicaid Other

## 2017-06-12 ENCOUNTER — Encounter: Payer: Medicaid Other | Admitting: Maternal Newborn

## 2017-07-01 ENCOUNTER — Ambulatory Visit (INDEPENDENT_AMBULATORY_CARE_PROVIDER_SITE_OTHER): Payer: Medicaid Other | Admitting: Obstetrics and Gynecology

## 2017-07-01 ENCOUNTER — Encounter: Payer: Self-pay | Admitting: Obstetrics and Gynecology

## 2017-07-01 ENCOUNTER — Other Ambulatory Visit: Payer: Medicaid Other

## 2017-07-01 ENCOUNTER — Telehealth: Payer: Self-pay | Admitting: Obstetrics and Gynecology

## 2017-07-01 VITALS — BP 124/78 | Wt 282.0 lb

## 2017-07-01 DIAGNOSIS — A6000 Herpesviral infection of urogenital system, unspecified: Secondary | ICD-10-CM

## 2017-07-01 DIAGNOSIS — O0990 Supervision of high risk pregnancy, unspecified, unspecified trimester: Secondary | ICD-10-CM

## 2017-07-01 DIAGNOSIS — F419 Anxiety disorder, unspecified: Secondary | ICD-10-CM

## 2017-07-01 DIAGNOSIS — Z3A33 33 weeks gestation of pregnancy: Secondary | ICD-10-CM

## 2017-07-01 DIAGNOSIS — O99210 Obesity complicating pregnancy, unspecified trimester: Secondary | ICD-10-CM

## 2017-07-01 DIAGNOSIS — O99810 Abnormal glucose complicating pregnancy: Secondary | ICD-10-CM

## 2017-07-01 DIAGNOSIS — O99320 Drug use complicating pregnancy, unspecified trimester: Secondary | ICD-10-CM

## 2017-07-01 DIAGNOSIS — F1290 Cannabis use, unspecified, uncomplicated: Secondary | ICD-10-CM

## 2017-07-01 DIAGNOSIS — Z6841 Body Mass Index (BMI) 40.0 and over, adult: Secondary | ICD-10-CM

## 2017-07-01 DIAGNOSIS — F32A Depression, unspecified: Secondary | ICD-10-CM

## 2017-07-01 DIAGNOSIS — F329 Major depressive disorder, single episode, unspecified: Secondary | ICD-10-CM

## 2017-07-01 DIAGNOSIS — O99343 Other mental disorders complicating pregnancy, third trimester: Secondary | ICD-10-CM

## 2017-07-01 DIAGNOSIS — O99013 Anemia complicating pregnancy, third trimester: Secondary | ICD-10-CM

## 2017-07-01 MED ORDER — OMEPRAZOLE 20 MG PO CPDR: 20.0000 mg | DELAYED_RELEASE_CAPSULE | Freq: Every day | ORAL | 2 refills | Status: DC

## 2017-07-01 NOTE — Telephone Encounter (Signed)
Pt is schedule 07/08/17

## 2017-07-01 NOTE — Telephone Encounter (Signed)
-----   Message from Conard Novak, MD sent at 07/01/2017  9:41 AM EDT ----- Regarding: growth u/s schedule Hi Huntley Dec, This patient needs a growth u/s with her next OB appointment. She did not schedule one when she checked out. WOuld you mind calling her to schedule one. It is vitally important that she have this ultrasound. Thank you!

## 2017-07-01 NOTE — Progress Notes (Signed)
Routine Prenatal Care Visit  Subjective  Carolyn Rosales is a 31 y.o. G3P1001 at [redacted]w[redacted]d being seen today for ongoing prenatal care.  She is currently monitored for the following issues for this high-risk pregnancy and has Depression; Anxiety; Maternal obesity, antepartum; Supervision of high risk pregnancy, antepartum; Marijuana use; Herpes genitalis; History of abnormal cervical Pap smear; Substance abuse affecting pregnancy, antepartum; BMI 40.0-44.9, adult (HCC); and Abnormal glucose tolerance test (GTT) during pregnancy, antepartum on her problem list.  ----------------------------------------------------------------------------------- Patient reports heartburn.   Contractions: Not present. Vag. Bleeding: None.  Movement: Present. Denies leaking of fluid. \ Could not remain fasting for 3h gtt today (second time has had to reschedule).  Reschedule for one week. If doesn't keep then will have to do 4x per day finger stick glucose.  ----------------------------------------------------------------------------------- The following portions of the patient's history were reviewed and updated as appropriate: allergies, current medications, past family history, past medical history, past social history, past surgical history and problem list. Problem list updated.  Objective  Blood pressure 124/78, weight 282 lb (127.9 kg), last menstrual period 11/07/2016, unknown if currently breastfeeding. Pregravid weight 253 lb (114.8 kg) Total Weight Gain 29 lb (13.2 kg) Urinalysis: Urine Protein: Negative Urine Glucose: Negative  Fetal Status: Fetal Heart Rate (bpm): 135 Fundal Height: 36 cm Movement: Present     General:  Alert, oriented and cooperative. Patient is in no acute distress.  Skin: Skin is warm and dry. No rash noted.   Cardiovascular: Normal heart rate noted  Respiratory: Normal respiratory effort, no problems with respiration noted  Abdomen: Soft, gravid, appropriate for  gestational age. Pain/Pressure: Absent     Pelvic:  Cervical exam deferred        Extremities: Normal range of motion.     Mental Status: Normal mood and affect. Normal behavior. Normal judgment and thought content.   Assessment   31 y.o. G3P1001 at [redacted]w[redacted]d by  08/14/2017, by Last Menstrual Period presenting for routine prenatal visit  Plan   pregnancy #2 Problems (from 04/09/17 to present)    Problem Noted Resolved   BMI 40.0-44.9, adult (HCC) 07/01/2017 by Conard Novak, MD No   Abnormal glucose tolerance test (GTT) during pregnancy, antepartum 07/01/2017 by Conard Novak, MD No   Overview Signed 07/01/2017  9:37 AM by Conard Novak, MD     if pt does not get 3h gtt by 34 weeks, then will need to do finger stick BG monitoring.   - rescheduled twice (last time was 9/26)      Substance abuse affecting pregnancy, antepartum 05/13/2017 by Vena Austria, MD No   Overview Signed 05/13/2017  9:17 PM by Vena Austria, MD    +UDS for [facility] at NO Repeat at transfer in appt 04/09/17 positive. Patient states has stopped using MJ  Continue random UDS checks      Herpes genitalis 04/13/2017 by Farrel Conners, CNM No   Overview Addendum 07/01/2017  9:36 AM by Conard Novak, MD     Start Valtrex 500 mgm BID at 36 week      Depression 04/09/2017 by Farrel Conners, CNM No   Anxiety 04/09/2017 by Farrel Conners, CNM No   Maternal obesity, antepartum 04/09/2017 by Farrel Conners, CNM No   Overview Addendum 07/01/2017  9:37 AM by Conard Novak, MD     BMI >=40  early 1h gtt - not done [ x] u/s for dating    Growth u/s 28 , 32 [ordered (  34 wks) ], 36 weeks   NST/AFI weekly 36+ weeks (36[] , 37[] , 38[] , 39[] , 40[] )  IOL by 41 weeks (scheduled, prn )       Supervision of high risk pregnancy, antepartum 04/09/2017 by Farrel Conners, CNM No   Overview Addendum 05/29/2017 12:42 PM by Vena Austria, MD    [facility] (transferred  from [place]) Prenatal Labs  Dating LMP = 6w [place] Blood type: AB/Positive/-- (03/25 0000)   Genetic Screen NIPS: Normal XX Antibody:  neg  Anatomic [place] Completed 05/13/17 Rubella:   I Varicella:   GTT Early:               Third trimester: 169 RPR: Nonreactive (03/25 0000)   Rhogam  HBsAg: Negative (03/25 0000)   TDaP vaccine                       Flu Shot: HIV: Non-reactive (03/25 0000)   Baby Food Breast/bottle                     GBS:   Contraception none Pap:NIL with POS HRHPV  CBB     CS/VBAC    Support Person               Marijuana use 04/09/2017 by Farrel Conners, CNM No   Overview Addendum 05/13/2017  9:11 PM by Vena Austria, MD    +UDS for [facility] at NO Repeat at transfer in appt 04/09/17 positive. Patient states has stopped using MJ  Continue random UDS checks         Preterm labor symptoms and general obstetric precautions including but not limited to vaginal bleeding, contractions, leaking of fluid and fetal movement were reviewed in detail with the patient. Please refer to After Visit Summary for other counseling recommendations.   Return in about 1 week (around 07/08/2017) for schedule growth ultrasound and Routine prenatal. Also schedule 3 hour gtt.   Thomasene Mohair, MD  07/01/2017 9:41 AM

## 2017-07-08 ENCOUNTER — Encounter: Payer: Self-pay | Admitting: Obstetrics and Gynecology

## 2017-07-08 ENCOUNTER — Encounter: Payer: Medicaid Other | Admitting: Maternal Newborn

## 2017-07-08 ENCOUNTER — Ambulatory Visit (INDEPENDENT_AMBULATORY_CARE_PROVIDER_SITE_OTHER): Payer: Medicaid Other | Admitting: Obstetrics and Gynecology

## 2017-07-08 ENCOUNTER — Ambulatory Visit (INDEPENDENT_AMBULATORY_CARE_PROVIDER_SITE_OTHER): Payer: Medicaid Other

## 2017-07-08 ENCOUNTER — Other Ambulatory Visit: Payer: Medicaid Other

## 2017-07-08 VITALS — BP 132/84 | Wt 284.0 lb

## 2017-07-08 DIAGNOSIS — Z6841 Body Mass Index (BMI) 40.0 and over, adult: Secondary | ICD-10-CM

## 2017-07-08 DIAGNOSIS — O0990 Supervision of high risk pregnancy, unspecified, unspecified trimester: Secondary | ICD-10-CM | POA: Diagnosis not present

## 2017-07-08 DIAGNOSIS — A6000 Herpesviral infection of urogenital system, unspecified: Secondary | ICD-10-CM

## 2017-07-08 DIAGNOSIS — O99210 Obesity complicating pregnancy, unspecified trimester: Secondary | ICD-10-CM

## 2017-07-08 DIAGNOSIS — F329 Major depressive disorder, single episode, unspecified: Secondary | ICD-10-CM

## 2017-07-08 DIAGNOSIS — O99320 Drug use complicating pregnancy, unspecified trimester: Secondary | ICD-10-CM

## 2017-07-08 DIAGNOSIS — F32A Depression, unspecified: Secondary | ICD-10-CM

## 2017-07-08 DIAGNOSIS — F1290 Cannabis use, unspecified, uncomplicated: Secondary | ICD-10-CM

## 2017-07-08 DIAGNOSIS — Z3A34 34 weeks gestation of pregnancy: Secondary | ICD-10-CM

## 2017-07-08 DIAGNOSIS — R7309 Other abnormal glucose: Secondary | ICD-10-CM

## 2017-07-08 DIAGNOSIS — O99343 Other mental disorders complicating pregnancy, third trimester: Secondary | ICD-10-CM

## 2017-07-08 DIAGNOSIS — Z3A28 28 weeks gestation of pregnancy: Secondary | ICD-10-CM

## 2017-07-08 DIAGNOSIS — F1910 Other psychoactive substance abuse, uncomplicated: Secondary | ICD-10-CM

## 2017-07-08 DIAGNOSIS — F419 Anxiety disorder, unspecified: Secondary | ICD-10-CM

## 2017-07-08 MED ORDER — VALACYCLOVIR HCL 500 MG PO TABS: 500.0000 mg | ORAL_TABLET | Freq: Two times a day (BID) | ORAL | 3 refills | Status: DC

## 2017-07-08 NOTE — Progress Notes (Signed)
Routine Prenatal Care Visit  Subjective  Carolyn Rosales is a 31 y.o. G2P1001 at [redacted]w[redacted]d being seen today for ongoing prenatal care.  She is currently monitored for the following issues for this high-risk pregnancy and has Depression; Anxiety; Maternal obesity, antepartum; Supervision of high risk pregnancy, antepartum; Marijuana use; Herpes genitalis; History of abnormal cervical Pap smear; Substance abuse affecting pregnancy, antepartum; BMI 40.0-44.9, adult (HCC); and Abnormal glucose tolerance test (GTT) during pregnancy, antepartum on her problem list.  ----------------------------------------------------------------------------------- Patient reports no complaints.   Contractions: Not present. Vag. Bleeding: None.  Movement: Present. Denies leaking of fluid.  3h gtt today U/S for growth today 57th%ile, afi 16.5cm rx for valtrex given to start at 36 weeks ----------------------------------------------------------------------------------- The following portions of the patient's history were reviewed and updated as appropriate: allergies, current medications, past family history, past medical history, past social history, past surgical history and problem list. Problem list updated.  Objective  Blood pressure 132/84, weight 284 lb (128.8 kg), last menstrual period 11/07/2016, unknown if currently breastfeeding. Pregravid weight 253 lb (114.8 kg) Total Weight Gain 31 lb (14.1 kg) Urinalysis: Urine Protein: Negative Urine Glucose: Negative  Fetal Status: Fetal Heart Rate (bpm): Present   Movement: Present     General:  Alert, oriented and cooperative. Patient is in no acute distress.  Skin: Skin is warm and dry. No rash noted.   Cardiovascular: Normal heart rate noted  Respiratory: Normal respiratory effort, no problems with respiration noted  Abdomen: Soft, gravid, appropriate for gestational age. Pain/Pressure: Absent     Pelvic:  Cervical exam deferred        Extremities:  Normal range of motion.     Mental Status: Normal mood and affect. Normal behavior. Normal judgment and thought content.   Assessment   31 y.o. G2P1001 at [redacted]w[redacted]d by  08/14/2017, by Last Menstrual Period presenting for routine prenatal visit  Plan   pregnancy #2 Problems (from 04/09/17 to present)    Problem Noted Resolved   BMI 40.0-44.9, adult (HCC) 07/01/2017 by Conard Novak, MD No   Abnormal glucose tolerance test (GTT) during pregnancy, antepartum 07/01/2017 by Conard Novak, MD No   Overview Signed 07/01/2017  9:37 AM by Conard Novak, MD     if pt does not get 3h gtt by 34 weeks, then will need to do finger stick BG monitoring.   - rescheduled twice (last time was 9/26)      Substance abuse affecting pregnancy, antepartum 05/13/2017 by Vena Austria, MD No   Overview Signed 05/13/2017  9:17 PM by Vena Austria, MD    +UDS for [facility] at NO Repeat at transfer in appt 04/09/17 positive. Patient states has stopped using MJ  Continue random UDS checks      Herpes genitalis 04/13/2017 by Farrel Conners, CNM No   Overview Addendum 07/01/2017  9:36 AM by Conard Novak, MD    [ord'd] Start Valtrex 500 mgm BID at 36 week      Depression 04/09/2017 by Farrel Conners, CNM No   Anxiety 04/09/2017 by Farrel Conners, CNM No   Maternal obesity, antepartum 04/09/2017 by Farrel Conners, CNM No   Overview Addendum 07/01/2017  9:37 AM by Conard Novak, MD     BMI >=40 [-] early 1h gtt - not done [ x] u/s for dating    Growth u/s 28 , 32 [X(34 wks) ], 36 weeks   NST/AFI weekly 36+ weeks (36[] , 37[] , 38[] , 39[] , 40[] )   IOL by 41 weeks (scheduled, prn )      Supervision of high risk pregnancy, antepartum 04/09/2017 by Farrel Conners, CNM No   Overview Addendum 05/29/2017 12:42 PM by Vena Austria, MD    [facility] (transferred from [place]) Prenatal Labs  Dating LMP = 6w [place] Blood type: AB/Positive/-- (03/25 0000)   Genetic Screen  NIPS: Normal XX Antibody:  neg  Anatomic [place] Completed 05/13/17 Rubella:   I Varicella:   GTT Early:               Third trimester: 169 RPR: Nonreactive (03/25 0000)   Rhogam  HBsAg: Negative (03/25 0000)   TDaP vaccine                       Flu Shot: HIV: Non-reactive (03/25 0000)   Baby Food Breast/bottle                     GBS:   Contraception none Pap:NIL with POS HRHPV  CBB     CS/VBAC    Support Person             Marijuana use 04/09/2017 by Farrel Conners, CNM No   Overview Addendum 05/13/2017  9:11 PM by Vena Austria, MD    +UDS for [facility] at NO Repeat at transfer in appt 04/09/17 positive. Patient states has stopped using MJ  Continue random UDS checks        Preterm labor symptoms and general obstetric precautions including but not limited to vaginal bleeding, contractions, leaking of fluid and fetal movement were reviewed in detail with the patient. Please refer to After Visit Summary for other counseling recommendations.   Return in about 2 weeks (around 07/22/2017) for u/s for AFI with Routine Prenatal Appointment with NST.  Thomasene Mohair, MD  07/08/2017 12:01 PM

## 2017-07-09 LAB — GESTATIONAL GLUCOSE TOLERANCE
GLUCOSE 1 HOUR GTT: 175 mg/dL (ref 65–179)
Glucose, Fasting: 95 mg/dL — ABNORMAL HIGH (ref 65–94)
Glucose, GTT - 2 Hour: 179 mg/dL — ABNORMAL HIGH (ref 65–154)
Glucose, GTT - 3 Hour: 114 mg/dL (ref 65–139)

## 2017-07-15 ENCOUNTER — Other Ambulatory Visit: Payer: Self-pay | Admitting: Obstetrics and Gynecology

## 2017-07-15 ENCOUNTER — Encounter: Payer: Self-pay | Admitting: Obstetrics and Gynecology

## 2017-07-15 DIAGNOSIS — O24419 Gestational diabetes mellitus in pregnancy, unspecified control: Secondary | ICD-10-CM | POA: Insufficient documentation

## 2017-07-15 MED ORDER — ACCU-CHEK NANO SMARTVIEW W/DEVICE KIT: 1.0000 | PACK | 0 refills | Status: DC

## 2017-07-15 MED ORDER — GLUCOSE BLOOD VI STRP: ORAL_STRIP | 12 refills | Status: DC

## 2017-07-15 MED ORDER — ACCU-CHEK FASTCLIX LANCETS MISC: 1.0000 [IU] | Freq: Four times a day (QID) | 12 refills | Status: DC

## 2017-07-24 ENCOUNTER — Ambulatory Visit (INDEPENDENT_AMBULATORY_CARE_PROVIDER_SITE_OTHER): Payer: Medicaid Other

## 2017-07-24 ENCOUNTER — Ambulatory Visit (INDEPENDENT_AMBULATORY_CARE_PROVIDER_SITE_OTHER): Payer: Medicaid Other | Admitting: Advanced Practice Midwife

## 2017-07-24 ENCOUNTER — Encounter: Payer: Self-pay | Admitting: Advanced Practice Midwife

## 2017-07-24 VITALS — BP 140/84 | Wt 287.0 lb

## 2017-07-24 DIAGNOSIS — Z6841 Body Mass Index (BMI) 40.0 and over, adult: Secondary | ICD-10-CM

## 2017-07-24 DIAGNOSIS — O0990 Supervision of high risk pregnancy, unspecified, unspecified trimester: Secondary | ICD-10-CM

## 2017-07-24 DIAGNOSIS — O99210 Obesity complicating pregnancy, unspecified trimester: Secondary | ICD-10-CM

## 2017-07-24 DIAGNOSIS — Z362 Encounter for other antenatal screening follow-up: Secondary | ICD-10-CM | POA: Diagnosis not present

## 2017-07-24 DIAGNOSIS — O24419 Gestational diabetes mellitus in pregnancy, unspecified control: Secondary | ICD-10-CM

## 2017-07-24 DIAGNOSIS — Z3A37 37 weeks gestation of pregnancy: Secondary | ICD-10-CM

## 2017-07-24 DIAGNOSIS — Z3685 Encounter for antenatal screening for Streptococcus B: Secondary | ICD-10-CM

## 2017-07-24 DIAGNOSIS — Z113 Encounter for screening for infections with a predominantly sexual mode of transmission: Secondary | ICD-10-CM

## 2017-07-24 NOTE — Progress Notes (Signed)
  Routine Prenatal Care Visit  Subjective  Carolyn Rosales is a 31 y.o. G3P1001 at 37 weeks being seen today for ongoing prenatal care.  She is currently monitored for the following issues for this high-risk pregnancy and has Depression; Anxiety; Maternal obesity, antepartum; Supervision of high risk pregnancy, antepartum; Marijuana use; Herpes genitalis; History of abnormal cervical Pap smear; Substance abuse affecting pregnancy, antepartum; BMI 40.0-44.9, adult (HCC); and Gestational diabetes on her problem list.  ----------------------------------------------------------------------------------- Patient reports no complaints.  Her Rx for Valtrex was not ready for pickup until today. She did pick up her GDM supplies but does not know how to use them. She did not bring them to the visit today. She had not yet heard the results of her 3 hour due to her phone breaking.   Contractions: Not present.  .  Movement: Present. Denies leaking of fluid.  ----------------------------------------------------------------------------------- The following portions of the patient's history were reviewed and updated as appropriate: allergies, current medications, past family history, past medical history, past social history, past surgical history and problem list. Problem list updated.   Objective  Blood pressure 140/84, weight 287 lb (130.2 kg), last menstrual period 11/17/2016, unknown if currently breastfeeding. Pregravid weight Pregravid weight not on file Total Weight Gain Not found. Urinalysis: Urine Protein: Trace Urine Glucose: 1+  Fetal Status: Fetal Heart Rate (bpm): 140   Movement: Present  Presentation: Vertex  AFI today 16.35 NST is reactive 20 minute strip, 140 bpm, moderate variability, +accelerations, -decelerations  General:  Alert, oriented and cooperative. Patient is in no acute distress.  Skin: Skin is warm and dry. No rash noted.   Cardiovascular: Normal heart rate noted    Respiratory: Normal respiratory effort, no problems with respiration noted  Abdomen: Soft, gravid, appropriate for gestational age. Pain/Pressure: Present     Pelvic:  cervix is visually fingertip dilated , no active herpes lesions seen today  Extremities: Normal range of motion.     Mental Status: Normal mood and affect. Normal behavior. Normal judgment and thought content.   Assessment   31 y.o. G3P1001 at 37 weeks by LMP presenting for routine prenatal visit  Plan   SECOND Problems (from 07/24/17 to present)    No problems associated with this episode.       Term labor symptoms and general obstetric precautions including but not limited to vaginal bleeding, contractions, leaking of fluid and fetal movement were reviewed in detail with the patient. Please refer to After Visit Summary for other counseling recommendations.   Return in about 1 week (around 07/31/2017) for afi/nst/rob.  Referral sent for [facility]. Reminded/encouraged to eat healthy, decrease sugar/carbs, go to [facility] appointment for GDM instructions. She is reminded to start taking her valtrex now.   Tresea MallJane Bland Rudzinski, CNM  07/24/2017 4:46 PM

## 2017-07-24 NOTE — Patient Instructions (Signed)
Diabetes Mellitus and Exercise Exercising regularly is important for your overall health, especially when you have diabetes (diabetes mellitus). Exercising is not only about losing weight. It has many health benefits, such as increasing muscle strength and bone density and reducing body fat and stress. This leads to improved fitness, flexibility, and endurance, all of which result in better overall health. Exercise has additional benefits for people with diabetes, including:  Reducing appetite.  Helping to lower and control blood glucose.  Lowering blood pressure.  Helping to control amounts of fatty substances (lipids) in the blood, such as cholesterol and triglycerides.  Helping the body to respond better to insulin (improving insulin sensitivity).  Reducing how much insulin the body needs.  Decreasing the risk for heart disease by: ? Lowering cholesterol and triglyceride levels. ? Increasing the levels of good cholesterol. ? Lowering blood glucose levels.  What is my activity plan? Your health care provider or certified diabetes educator can help you make a plan for the type and frequency of exercise (activity plan) that works for you. Make sure that you:  Do at least 150 minutes of moderate-intensity or vigorous-intensity exercise each week. This could be brisk walking, biking, or water aerobics. ? Do stretching and strength exercises, such as yoga or weightlifting, at least 2 times a week. ? Spread out your activity over at least 3 days of the week.  Get some form of physical activity every day. ? Do not go more than 2 days in a row without some kind of physical activity. ? Avoid being inactive for more than 90 minutes at a time. Take frequent breaks to walk or stretch.  Choose a type of exercise or activity that you enjoy, and set realistic goals.  Start slowly, and gradually increase the intensity of your exercise over time.  What do I need to know about managing my  diabetes?  Check your blood glucose before and after exercising. ? If your blood glucose is higher than 240 mg/dL (13.3 mmol/L) before you exercise, check your urine for ketones. If you have ketones in your urine, do not exercise until your blood glucose returns to normal.  Know the symptoms of low blood glucose (hypoglycemia) and how to treat it. Your risk for hypoglycemia increases during and after exercise. Common symptoms of hypoglycemia can include: ? Hunger. ? Anxiety. ? Sweating and feeling clammy. ? Confusion. ? Dizziness or feeling light-headed. ? Increased heart rate or palpitations. ? Blurry vision. ? Tingling or numbness around the mouth, lips, or tongue. ? Tremors or shakes. ? Irritability.  Keep a rapid-acting carbohydrate snack available before, during, and after exercise to help prevent or treat hypoglycemia.  Avoid injecting insulin into areas of the body that are going to be exercised. For example, avoid injecting insulin into: ? The arms, when playing tennis. ? The legs, when jogging.  Keep records of your exercise habits. Doing this can help you and your health care provider adjust your diabetes management plan as needed. Write down: ? Food that you eat before and after you exercise. ? Blood glucose levels before and after you exercise. ? The type and amount of exercise you have done. ? When your insulin is expected to peak, if you use insulin. Avoid exercising at times when your insulin is peaking.  When you start a new exercise or activity, work with your health care provider to make sure the activity is safe for you, and to adjust your insulin, medicines, or food intake as needed.    Drink plenty of water while you exercise to prevent dehydration or heat stroke. Drink enough fluid to keep your urine clear or pale yellow. This information is not intended to replace advice given to you by your health care provider. Make sure you discuss any questions you have with  your health care provider. Document Released: 12/13/2003 Document Revised: 04/11/2016 Document Reviewed: 03/03/2016 Elsevier Interactive Patient Education  2018 [employer]. Gestational Diabetes Mellitus, Self Care Caring for yourself after you have been diagnosed with gestational diabetes (gestational diabetes mellitus) means keeping your blood sugar (glucose) under control with a balance of:  Nutrition.  Exercise.  Lifestyle changes.  Medicines or insulin, if necessary.  Support from your team of health care providers and others.  The following information explains what you need to know to manage your gestational diabetes at home. What do I need to do to manage my blood glucose?  Check your blood glucose every day during your pregnancy. Do this as often as told by your health care provider.  Contact your health care provider if your blood glucose is above your target for 2 tests in a row. Your health care provider will set individualized treatment goals for you. Generally, the goal of treatment is to maintain the following blood glucose levels during pregnancy:  After not eating for 8 hours (after fasting): at or below 95 mg/dL (5.3 mmol/L).  After meals (postprandial): ? One hour after a meal: at or below 140 mg/dL (7.8 mmol/L). ? Two hours after a meal: at or below 120 mg/dL (6.7 mmol/L).  A1c (hemoglobin A1c) level: 6-6.5%.  What do I need to know about hyperglycemia and hypoglycemia? What is hyperglycemia? Hyperglycemia, also called high blood glucose, occurs when blood glucose is too high. Make sure you know the early signs of hyperglycemia, such as:  Increased thirst.  Hunger.  Feeling very tired.  Needing to urinate more often than usual.  Blurry vision.  What is hypoglycemia? Hypoglycemia, also called low blood glucose, occurswith a blood glucose level at or below 70 mg/dL (3.9 mmol/L). The risk for hypoglycemia increases during or after exercise, during  sleep, during illness, and when skipping meals or not eating for a long time (fasting). It is important to know the symptoms of hypoglycemia and treat it right away. Always have a 15-gram rapid-acting carbohydrate snack with you to treat low blood glucose.Family members and close friends should also know the symptoms and should understand how to treat hypoglycemia, in case you are not able to treat yourself. What are the symptoms of hypoglycemia? Hypoglycemia symptoms can include:  Hunger.  Anxiety.  Sweating and feeling clammy.  Confusion.  Dizziness or feeling light-headed.  Sleepiness.  Nausea.  Increased heart rate.  Headache.  Blurry vision.  Seizure.  Nightmares.  Tingling or numbness around the mouth, lips, or tongue.  A change in speech.  Decreased ability to concentrate.  A change in coordination.  Restless sleep.  Tremors or shakes.  Fainting.  Irritability.  How do I treat hypoglycemia?  If you are alert and able to swallow safely, follow the 15:15 rule:  Take 15 grams of a rapid-acting carbohydrate. Rapid-acting options include: ? 1 tube of glucose gel. ? 3 glucose pills. ? 6-8 pieces of hard candy. ? 4 oz (120 mL) of fruit juice. ? 4 oz (120 mL) of regular (not diet) soda.  Check your blood glucose 15 minutes after you take the carbohydrate.  If the repeat blood glucose level is still at or below  70 mg/dL (3.9 mmol/L), take 15 grams of a carbohydrate again.  If your blood glucose level does not increase above 70 mg/dL (3.9 mmol/L) after 3 tries, seek emergency medical care.  After your blood glucose level returns to normal, eat a meal or a snack within 1 hour.  How do I treat severe hypoglycemia? Severe hypoglycemia is when your blood glucose level is at or below 54 mg/dL (3 mmol/L). Severe hypoglycemia is an emergency. Do not wait to see if the symptoms will go away. Get medical help right away. Call your local emergency services (911 in  the [place] Do not drive yourself to the hospital. If you have severe hypoglycemia and you cannot eat or drink, you may need an injection of glucagon. A family member or close friend should learn how to check your blood glucose and how to give you a glucagon injection. Ask your health care provider if you need to have an emergency glucagon injection kit available. Severe hypoglycemia may need to be treated in a hospital. The treatment may include getting glucose through an IV tube. You may also need treatment for the cause of your hypoglycemia. What else can I do to manage my gestational diabetes? Take your diabetes medicines as told  If your health care provider prescribed insulin or diabetes medicines, take them every day.  Do not run out of insulin or other diabetes medicines that you take. Plan ahead so you always have these available.  If you use insulin, adjust your dosage based on how physically active you are and what foods you eat. Your health care provider will tell you how to adjust your dosage. Make healthy food choices  The things that you eat and drink affect your blood glucose. Making good choices helps to control your diabetes and prevent other health problems. A healthy meal plan includes eating lean proteins, complex carbohydrates, fresh fruits and vegetables, low-fat dairy products, and healthy fats. Make an appointment to see a diet and nutrition specialist (registered dietitian) to help you create an eating plan that is right for you. Make sure that you:  Follow instructions from your health care provider about eating or drinking restrictions.  Drink enough fluid to keep your urine clear or pale yellow.  Eat healthy snacks between nutritious meals.  Track the carbohydrates that you eat. Do this by reading food labels and learning the standard serving sizes of foods.  Follow your sick day plan whenever you cannot eat or drink as usual. Make this plan in advance with your  health care provider.  Stay active   Do at least 30 minutes of physical activity a day, or as much physical activity as your health care provider recommends during your pregnancy. ? Doing 10 minutes of exercise 30 minutes after each meal may help to control postprandial blood glucose levels.  If you start a new exercise or activity, work with your health care provider to adjust your insulin, medicines, or food intake as needed. Make healthy lifestyle choices  Do not drink alcohol.  Do not use any tobacco products, such as cigarettes, chewing tobacco, and e-cigarettes. If you need help quitting, ask your health care provider.  Learn to manage stress. If you need help with this, ask your health care provider. Care for your body  Keep your immunizations up to date.  Brush your teeth and gums two times a day, and floss at least one time a day.  Visit your dentist at least once every 6 months.  Maintain a healthy weight during your pregnancy. General instructions   Take over-the-counter and prescription medicines only as told by your health care provider.  Talk with your health care provider about your risk for high blood pressure during pregnancy (preeclampsia or eclampsia).  Share your diabetes management plan with people in your workplace, school, and household.  Check your urine for ketones during your pregnancy when you are ill and as told by your health care provider.  Carry a medical alert card or wear medical alert jewelry.  Ask your health care provider: ? Do I need to meet with a diabetes educator? ? Where can I find a support group for people with diabetes?  Keep all follow-up visits during your pregnancy (prenatal) and after delivery (postnatal) as told by your health care provider. This is important. Get the care that you need after delivery  Have your blood glucose level checked 4-12 weeks after delivery. This is done with an oral glucose tolerance test  (OGTT).  Get screened for diabetes at least every 3 years, or as often as told by your health care provider. Where to find more information: To learn more about gestational diabetes, visit:  American Diabetes Association (ADA): www.diabetes.org/diabetes-basics/gestational  Centers for Disease Control and Prevention (CDC): http://sanchez-watson.com/.pdf  This information is not intended to replace advice given to you by your health care provider. Make sure you discuss any questions you have with your health care provider. Document Released: 01/14/2016 Document Revised: 02/28/2016 Document Reviewed: 10/26/2015 Elsevier Interactive Patient Education  2017 [employer].

## 2017-07-26 LAB — GC/CHLAMYDIA PROBE AMP
CHLAMYDIA, DNA PROBE: NEGATIVE
Neisseria gonorrhoeae by PCR: NEGATIVE

## 2017-07-26 LAB — STREP GP B NAA: Strep Gp B NAA: POSITIVE — AB

## 2017-07-28 ENCOUNTER — Encounter: Payer: Self-pay | Admitting: Dietician

## 2017-07-28 ENCOUNTER — Encounter: Payer: 59 | Attending: Advanced Practice Midwife | Admitting: Dietician

## 2017-07-28 VITALS — BP 130/78 | Ht 66.5 in | Wt 284.4 lb

## 2017-07-28 DIAGNOSIS — Z6841 Body Mass Index (BMI) 40.0 and over, adult: Secondary | ICD-10-CM | POA: Diagnosis present

## 2017-07-28 DIAGNOSIS — O0993 Supervision of high risk pregnancy, unspecified, third trimester: Secondary | ICD-10-CM | POA: Insufficient documentation

## 2017-07-28 DIAGNOSIS — O24410 Gestational diabetes mellitus in pregnancy, diet controlled: Secondary | ICD-10-CM

## 2017-07-28 DIAGNOSIS — O24419 Gestational diabetes mellitus in pregnancy, unspecified control: Secondary | ICD-10-CM | POA: Insufficient documentation

## 2017-07-28 DIAGNOSIS — Z3A37 37 weeks gestation of pregnancy: Secondary | ICD-10-CM | POA: Insufficient documentation

## 2017-07-28 NOTE — Progress Notes (Signed)
Appt. Start Time: 1045 Appt. End Time: 1215  GDM Class 1 Diabetes Overview - define DM; state own type of DM; identify functions of pancreas and insulin; define insulin deficiency vs insulin resistance  Psychosocial - identify DM as a source of stress; state the effects of stress on BG control; verbalize appropriate stress management techniques; identify personal stress issues   Nutritional Management - describe effects of food on blood glucose; identify sources of carbohydrate, protein and fat; verbalize the importance of balance meals in controlling blood glucose; identify meals as well balanced or not; estimate servings of carbohydrate from menus; use food labels to identify servings size, content of carbohydrate, fiber, protein, fat, saturated fat and sodium; recognize food sources of fat, saturated fat, trans fat, sodium and verbalize goals for intake; describe healthful appropriate food choices when dining out   Exercise - describe the effects of exercise on blood glucose and importance of regular exercise in controlling diabetes; state a plan for personal exercise; verbalize contraindications for exercise  Medications - state name, dose, timing of currently prescribed medications; describe types of medications available for diabetes  Insulin Training - prepare and administer insulin accurately; state correct rotation pattern; verbalize safe and lawful needle disposal  Self-Monitoring - state importance of HBGM and demo procedure accurately; use HBGM results to effectively manage diabetes; identify importance of regular HbA1C testing and goals for results  Acute Complications/Sick Day Guidelines - recognize hyperglycemia and hypoglycemia with causes and effects; identify blood glucose results as high, low or in control; list steps in treating and preventing high and low blood glucose; state appropriate measure to manage blood glucose when ill (need for meds, HBGM plan, when to call physician,  need for fluids)  Chronic Complications/Foot, Skin, Eye Dental Care - identify possible long-term complications of diabetes (retinopathy, neuropathy, nephropathy, cardiovascular disease, infections); explain steps in prevention and treatment of chronic complications; state importance of daily self-foot exams; describe how to examine feet and what to look for; explain appropriate eye and dental care  Lifestyle Changes/Goals & Health/Community Resources - state benefits of making appropriate lifestyle changes; identify habits that need to change (meals, tobacco, alcohol); identify strategies to reduce risk factors for personal health; set goals for proper diabetes care; state need for and frequency of healthcare follow-up; describe appropriate community resources for good health (ADA, web sites, apps)   Pregnancy/Sexual Health - define gestational diabetes; state importance of good blood glucose control and birth control prior to pregnancy; state importance of good blood glucose control in preventing sexual problems (impotence, vaginal dryness, infections, loss of desire); state relationship of blood glucose control and pregnancy outcome; describe risk of maternal and fetal complications  Teaching Materials Used: Meter-accuchek nano (pt's own meter) General Meal Planning Guidelines Daily Food Record Gestational Diabetes Booklet Gestational Video Goals for Healthy Pregnancy

## 2017-07-28 NOTE — Patient Instructions (Signed)
Read booklet on Gestational Diabetes Follow Gestational Meal Planning Guidelines Complete a 3 Day Food Record and bring to next appointment Check blood sugars 4 x day - before breakfast and 2 hrs after every meal and record  Bring blood sugar log to all appointments Walk 20-30 minutes at least 5 x week if permitted by MD Next appointment:  08-03-17

## 2017-07-31 ENCOUNTER — Ambulatory Visit (INDEPENDENT_AMBULATORY_CARE_PROVIDER_SITE_OTHER): Payer: Medicaid Other | Admitting: Advanced Practice Midwife

## 2017-07-31 ENCOUNTER — Ambulatory Visit (INDEPENDENT_AMBULATORY_CARE_PROVIDER_SITE_OTHER): Payer: Medicaid Other

## 2017-07-31 VITALS — BP 136/86 | Wt 286.0 lb

## 2017-07-31 DIAGNOSIS — O24419 Gestational diabetes mellitus in pregnancy, unspecified control: Secondary | ICD-10-CM | POA: Diagnosis not present

## 2017-07-31 DIAGNOSIS — Z6841 Body Mass Index (BMI) 40.0 and over, adult: Secondary | ICD-10-CM

## 2017-07-31 DIAGNOSIS — Z362 Encounter for other antenatal screening follow-up: Secondary | ICD-10-CM | POA: Diagnosis not present

## 2017-07-31 DIAGNOSIS — O0990 Supervision of high risk pregnancy, unspecified, unspecified trimester: Secondary | ICD-10-CM

## 2017-07-31 DIAGNOSIS — Z3A38 38 weeks gestation of pregnancy: Secondary | ICD-10-CM

## 2017-07-31 NOTE — Patient Instructions (Signed)
Vaginal Delivery Vaginal delivery means that you will give birth by pushing your baby out of your birth canal (vagina). A team of health care providers will help you before, during, and after vaginal delivery. Birth experiences are unique for every woman and every pregnancy, and birth experiences vary depending on where you choose to give birth. What should I do to prepare for my baby's birth? Before your baby is born, it is important to talk with your health care provider about:  Your labor and delivery preferences. These may include: ? Medicines that you may be given. ? How you will manage your pain. This might include non-medical pain relief techniques or injectable pain relief such as epidural analgesia. ? How you and your baby will be monitored during labor and delivery. ? Who may be in the labor and delivery room with you. ? Your feelings about surgical delivery of your baby (cesarean delivery, or C-section) if this becomes necessary. ? Your feelings about receiving donated blood through an IV tube (blood transfusion) if this becomes necessary.  Whether you are able: ? To take pictures or videos of the birth. ? To eat during labor and delivery. ? To move around, walk, or change positions during labor and delivery.  What to expect after your baby is born, such as: ? Whether delayed umbilical cord clamping and cutting is offered. ? Who will care for your baby right after birth. ? Medicines or tests that may be recommended for your baby. ? Whether breastfeeding is supported in your hospital or birth center. ? How long you will be in the hospital or birth center.  How any medical conditions you have may affect your baby or your labor and delivery experience.  To prepare for your baby's birth, you should also:  Attend all of your health care visits before delivery (prenatal visits) as recommended by your health care provider. This is important.  Prepare your home for your baby's  arrival. Make sure that you have: ? Diapers. ? Baby clothing. ? Feeding equipment. ? Safe sleeping arrangements for you and your baby.  Install a car seat in your vehicle. Have your car seat checked by a certified car seat installer to make sure that it is installed safely.  Think about who will help you with your new baby at home for at least the first several weeks after delivery.  What can I expect when I arrive at the birth center or hospital? Once you are in labor and have been admitted into the hospital or birth center, your health care provider may:  Review your pregnancy history and any concerns you have.  Insert an IV tube into one of your veins. This is used to give you fluids and medicines.  Check your blood pressure, pulse, temperature, and heart rate (vital signs).  Check whether your bag of water (amniotic sac) has broken (ruptured).  Talk with you about your birth plan and discuss pain control options.  Monitoring Your health care provider may monitor your contractions (uterine monitoring) and your baby's heart rate (fetal monitoring). You may need to be monitored:  Often, but not continuously (intermittently).  All the time or for long periods at a time (continuously). Continuous monitoring may be needed if: ? You are taking certain medicines, such as medicine to relieve pain or make your contractions stronger. ? You have pregnancy or labor complications.  Monitoring may be done by:  Placing a special stethoscope or a handheld monitoring device on your abdomen to   check your baby's heartbeat, and feeling your abdomen for contractions. This method of monitoring does not continuously record your baby's heartbeat or your contractions.  Placing monitors on your abdomen (external monitors) to record your baby's heartbeat and the frequency and length of contractions. You may not have to wear external monitors all the time.  Placing monitors inside of your uterus  (internal monitors) to record your baby's heartbeat and the frequency, length, and strength of your contractions. ? Your health care provider may use internal monitors if he or she needs more information about the strength of your contractions or your baby's heart rate. ? Internal monitors are put in place by passing a thin, flexible wire through your vagina and into your uterus. Depending on the type of monitor, it may remain in your uterus or on your baby's head until birth. ? Your health care provider will discuss the benefits and risks of internal monitoring with you and will ask for your permission before inserting the monitors.  Telemetry. This is a type of continuous monitoring that can be done with external or internal monitors. Instead of having to stay in bed, you are able to move around during telemetry. Ask your health care provider if telemetry is an option for you.  Physical exam Your health care provider may perform a physical exam. This may include:  Checking whether your baby is positioned: ? With the head toward your vagina (head-down). This is most common. ? With the head toward the top of your uterus (head-up or breech). If your baby is in a breech position, your health care provider may try to turn your baby to a head-down position so you can deliver vaginally. If it does not seem that your baby can be born vaginally, your provider may recommend surgery to deliver your baby. In rare cases, you may be able to deliver vaginally if your baby is head-up (breech delivery). ? Lying sideways (transverse). Babies that are lying sideways cannot be delivered vaginally.  Checking your cervix to determine: ? Whether it is thinning out (effacing). ? Whether it is opening up (dilating). ? How low your baby has moved into your birth canal.  What are the three stages of labor and delivery?  Normal labor and delivery is divided into the following three stages: Stage 1  Stage 1 is the  longest stage of labor, and it can last for hours or days. Stage 1 includes: ? Early labor. This is when contractions may be irregular, or regular and mild. Generally, early labor contractions are more than 10 minutes apart. ? Active labor. This is when contractions get longer, more regular, more frequent, and more intense. ? The transition phase. This is when contractions happen very close together, are very intense, and may last longer than during any other part of labor.  Contractions generally feel mild, infrequent, and irregular at first. They get stronger, more frequent (about every 2-3 minutes), and more regular as you progress from early labor through active labor and transition.  Many women progress through stage 1 naturally, but you may need help to continue making progress. If this happens, your health care provider may talk with you about: ? Rupturing your amniotic sac if it has not ruptured yet. ? Giving you medicine to help make your contractions stronger and more frequent.  Stage 1 ends when your cervix is completely dilated to 4 inches (10 cm) and completely effaced. This happens at the end of the transition phase. Stage 2  Once   your cervix is completely effaced and dilated to 4 inches (10 cm), you may start to feel an urge to push. It is common for the body to naturally take a rest before feeling the urge to push, especially if you received an epidural or certain other pain medicines. This rest period may last for up to 1-2 hours, depending on your unique labor experience.  During stage 2, contractions are generally less painful, because pushing helps relieve contraction pain. Instead of contraction pain, you may feel stretching and burning pain, especially when the widest part of your baby's head passes through the vaginal opening (crowning).  Your health care provider will closely monitor your pushing progress and your baby's progress through the vagina during stage 2.  Your  health care provider may massage the area of skin between your vaginal opening and anus (perineum) or apply warm compresses to your perineum. This helps it stretch as the baby's head starts to crown, which can help prevent perineal tearing. ? In some cases, an incision may be made in your perineum (episiotomy) to allow the baby to pass through the vaginal opening. An episiotomy helps to make the opening of the vagina larger to allow more room for the baby to fit through.  It is very important to breathe and focus so your health care provider can control the delivery of your baby's head. Your health care provider may have you decrease the intensity of your pushing, to help prevent perineal tearing.  After delivery of your baby's head, the shoulders and the rest of the body generally deliver very quickly and without difficulty.  Once your baby is delivered, the umbilical cord may be cut right away, or this may be delayed for 1-2 minutes, depending on your baby's health. This may vary among health care providers, hospitals, and birth centers.  If you and your baby are healthy enough, your baby may be placed on your chest or abdomen to help maintain the baby's temperature and to help you bond with each other. Some mothers and babies start breastfeeding at this time. Your health care team will dry your baby and help keep your baby warm during this time.  Your baby may need immediate care if he or she: ? Showed signs of distress during labor. ? Has a medical condition. ? Was born too early (prematurely). ? Had a bowel movement before birth (meconium). ? Shows signs of difficulty transitioning from being inside the uterus to being outside of the uterus. If you are planning to breastfeed, your health care team will help you begin a feeding. Stage 3  The third stage of labor starts immediately after the birth of your baby and ends after you deliver the placenta. The placenta is an organ that develops  during pregnancy to provide oxygen and nutrients to your baby in the womb.  Delivering the placenta may require some pushing, and you may have mild contractions. Breastfeeding can stimulate contractions to help you deliver the placenta.  After the placenta is delivered, your uterus should tighten (contract) and become firm. This helps to stop bleeding in your uterus. To help your uterus contract and to control bleeding, your health care provider may: ? Give you medicine by injection, through an IV tube, by mouth, or through your rectum (rectally). ? Massage your abdomen or perform a vaginal exam to remove any blood clots that are left in your uterus. ? Empty your bladder by placing a thin, flexible tube (catheter) into your bladder. ? Encourage   you to breastfeed your baby. After labor is over, you and your baby will be monitored closely to ensure that you are both healthy until you are ready to go home. Your health care team will teach you how to care for yourself and your baby. This information is not intended to replace advice given to you by your health care provider. Make sure you discuss any questions you have with your health care provider. Document Released: 07/01/2008 Document Revised: 04/11/2016 Document Reviewed: 10/07/2015 Elsevier Interactive Patient Education  2018 [employer]  

## 2017-07-31 NOTE — Progress Notes (Signed)
  Routine Prenatal Care Visit  Subjective  Carolyn Rosales is a 31 y.o. G3P1001 at 7133w0d being seen today for ongoing prenatal care.  She is currently monitored for the following issues for this high-risk pregnancy and has Depression; Anxiety; Maternal obesity, antepartum; Supervision of high risk pregnancy, antepartum; Marijuana use; Herpes genitalis; History of abnormal cervical Pap smear; Substance abuse affecting pregnancy, antepartum; BMI 40.0-44.9, adult (HCC); and Gestational diabetes on her problem list.  ----------------------------------------------------------------------------------- Patient reports no complaints.  She has been taking her valtrex for 3 days since she picked up her prescription. She denies any outbreak at this time. She has also been checking her blood sugar for 3 days since her visit with nutrition. She has not been recording all of her results and she did not bring her log but says the first 24 hours her results were wnl. The last 2 days they have generally been high. FBS this morning was 113. After dinner last night was 160. She is reminded to check and record sugars Fasting and 2 hrs PP each day. She is reminded to bring her log to her next visit.  Contractions: Not present. Vag. Bleeding: None.  Movement: Present. Denies leaking of fluid.  ----------------------------------------------------------------------------------- The following portions of the patient's history were reviewed and updated as appropriate: allergies, current medications, past family history, past medical history, past social history, past surgical history and problem list. Problem list updated.   Objective  Blood pressure 136/86, weight 286 lb (129.7 kg), last menstrual period 11/17/2016, unknown if currently breastfeeding. Pregravid weight Pregravid weight not on file Total Weight Gain Not found. Urinalysis:      Fetal Status: Fetal Heart Rate (bpm): 140   Movement: Present   Presentation: Vertex  Growth today: 7# 12 ounces, 72%, AFI 15.53 cm, HC is 56%, AC is 86.7% NST today is reactive 20 minute strip, 140 bpm, moderate variability, + accelerations, -decelerations  General:  Alert, oriented and cooperative. Patient is in no acute distress.  Skin: Skin is warm and dry. No rash noted.   Cardiovascular: Normal heart rate noted  Respiratory: Normal respiratory effort, no problems with respiration noted  Abdomen: Soft, gravid, appropriate for gestational age. Pain/Pressure: Absent     Pelvic:  Cervical exam deferred        Extremities: Normal range of motion.     Mental Status: Normal mood and affect. Normal behavior. Normal judgment and thought content.   Assessment   31 y.o. G3P1001 at 4033w0d by  08/14/2017, Alternate EDD Entry presenting for routine prenatal visit  Plan   SECOND Problems (from 07/24/17 to present)    No problems associated with this episode.       Term labor symptoms and general obstetric precautions including but not limited to vaginal bleeding, contractions, leaking of fluid and fetal movement were reviewed in detail with the patient. Please refer to After Visit Summary for other counseling recommendations.   Return in about 1 week (around 08/07/2017) for afi/nst/rob.  Tresea MallJane Chancelor Hardrick, CNM  07/31/2017 3:01 PM

## 2017-07-31 NOTE — Progress Notes (Signed)
Afi/nst today  

## 2017-08-03 ENCOUNTER — Encounter: Payer: 59 | Admitting: Dietician

## 2017-08-03 ENCOUNTER — Encounter: Payer: Self-pay | Admitting: Dietician

## 2017-08-03 VITALS — BP 138/74 | Ht 66.5 in | Wt 286.6 lb

## 2017-08-03 DIAGNOSIS — O24410 Gestational diabetes mellitus in pregnancy, diet controlled: Secondary | ICD-10-CM

## 2017-08-03 DIAGNOSIS — O24419 Gestational diabetes mellitus in pregnancy, unspecified control: Secondary | ICD-10-CM | POA: Diagnosis not present

## 2017-08-03 NOTE — Progress Notes (Signed)
   Patient's BG record indicates BGs are above goal ranges; fasting BGs ranging 113-126, and post-meal BGs ranging 107-160. Patient mentioned eating fast food meals for 2-3 days after receiving paycheck last week.   Patient's diet recall indicates some meals likely high in carbohydrate due to food portions, milk intake in addition to starchy foods in meals. She is eating at regular intervals and has been working on [employer] food choices such as whole grain pasta.    Provided 1600kcal meal plan, and wrote individualized menus based on patient's food preferences.  Instructed patient on food safety, including avoidance of Listeriosis, and limiting mercury from fish.  Patient voices wish to lose weight and maintain healthy habits after delivery, so she can decrease risk for Type 2DM.  Discussed importance of maintaining healthy lifestyle habits to reduce risk of Type 2 DM as well as Gestational DM with any future pregnancies.  Advised patient to use any remaining testing supplies to test some BGs after delivery, and to have BG tested ideally annually, as well as prior to attempting future pregnancies.

## 2017-08-03 NOTE — Patient Instructions (Signed)
   Control portions of starchy foods like pasta. Remember to count 1cup of milk as one of the allowed carb servings.  Increase portions of protein foods and "free" veggies to make sure you feel satisfied and not hungry after meals.   Can try [employer] pasta (in a black box) for less effect on blood sugar.   Can also try low-sugar flavored waters such as [employer] Roaring Water, [employer] Splash.

## 2017-08-06 ENCOUNTER — Inpatient Hospital Stay
Admission: EM | Admit: 2017-08-06 | Discharge: 2017-08-09 | DRG: 806 | Disposition: A | Discharge status: Home / Self Care | Payer: 59 | Attending: Obstetrics and Gynecology | Admitting: Obstetrics and Gynecology

## 2017-08-06 ENCOUNTER — Ambulatory Visit (INDEPENDENT_AMBULATORY_CARE_PROVIDER_SITE_OTHER): Payer: Medicaid Other | Admitting: Maternal Newborn

## 2017-08-06 ENCOUNTER — Ambulatory Visit (INDEPENDENT_AMBULATORY_CARE_PROVIDER_SITE_OTHER): Payer: Medicaid Other

## 2017-08-06 VITALS — BP 144/78 | Wt 285.0 lb

## 2017-08-06 DIAGNOSIS — Z3A38 38 weeks gestation of pregnancy: Secondary | ICD-10-CM

## 2017-08-06 DIAGNOSIS — A6000 Herpesviral infection of urogenital system, unspecified: Secondary | ICD-10-CM | POA: Diagnosis present

## 2017-08-06 DIAGNOSIS — O99824 Streptococcus B carrier state complicating childbirth: Secondary | ICD-10-CM | POA: Diagnosis present

## 2017-08-06 DIAGNOSIS — O9962 Diseases of the digestive system complicating childbirth: Secondary | ICD-10-CM | POA: Diagnosis present

## 2017-08-06 DIAGNOSIS — O134 Gestational [pregnancy-induced] hypertension without significant proteinuria, complicating childbirth: Secondary | ICD-10-CM | POA: Diagnosis present

## 2017-08-06 DIAGNOSIS — O9081 Anemia of the puerperium: Secondary | ICD-10-CM | POA: Diagnosis not present

## 2017-08-06 DIAGNOSIS — D62 Acute posthemorrhagic anemia: Secondary | ICD-10-CM | POA: Diagnosis not present

## 2017-08-06 DIAGNOSIS — O9832 Other infections with a predominantly sexual mode of transmission complicating childbirth: Secondary | ICD-10-CM | POA: Diagnosis present

## 2017-08-06 DIAGNOSIS — O99214 Obesity complicating childbirth: Secondary | ICD-10-CM | POA: Diagnosis present

## 2017-08-06 DIAGNOSIS — F329 Major depressive disorder, single episode, unspecified: Secondary | ICD-10-CM | POA: Diagnosis present

## 2017-08-06 DIAGNOSIS — Z87891 Personal history of nicotine dependence: Secondary | ICD-10-CM

## 2017-08-06 DIAGNOSIS — Z23 Encounter for immunization: Secondary | ICD-10-CM

## 2017-08-06 DIAGNOSIS — O24419 Gestational diabetes mellitus in pregnancy, unspecified control: Secondary | ICD-10-CM | POA: Diagnosis not present

## 2017-08-06 DIAGNOSIS — O0990 Supervision of high risk pregnancy, unspecified, unspecified trimester: Secondary | ICD-10-CM | POA: Diagnosis not present

## 2017-08-06 DIAGNOSIS — O24429 Gestational diabetes mellitus in childbirth, unspecified control: Secondary | ICD-10-CM | POA: Diagnosis present

## 2017-08-06 DIAGNOSIS — K219 Gastro-esophageal reflux disease without esophagitis: Secondary | ICD-10-CM | POA: Diagnosis present

## 2017-08-06 DIAGNOSIS — O99344 Other mental disorders complicating childbirth: Secondary | ICD-10-CM | POA: Diagnosis present

## 2017-08-06 HISTORY — DX: Gestational diabetes mellitus in pregnancy, unspecified control: O24.419

## 2017-08-06 LAB — COMPREHENSIVE METABOLIC PANEL
ALBUMIN: 2.8 g/dL — AB (ref 3.5–5.0)
ALT: 14 U/L (ref 14–54)
AST: 19 U/L (ref 15–41)
Alkaline Phosphatase: 157 U/L — ABNORMAL HIGH (ref 38–126)
Anion gap: 7 (ref 5–15)
BUN: 11 mg/dL (ref 6–20)
CO2: 21 mmol/L — AB (ref 22–32)
Calcium: 9.1 mg/dL (ref 8.9–10.3)
Chloride: 106 mmol/L (ref 101–111)
Creatinine, Ser: 0.45 mg/dL (ref 0.44–1.00)
GFR calc Af Amer: >60 mL/min (ref >60)
GFR calc non Af Amer: >60 mL/min (ref >60)
GLUCOSE: 80 mg/dL (ref 65–99)
POTASSIUM: 3.9 mmol/L (ref 3.5–5.1)
SODIUM: 134 mmol/L — AB (ref 135–145)
TOTAL PROTEIN: 6.9 g/dL (ref 6.5–8.1)
Total Bilirubin: 0.5 mg/dL (ref 0.3–1.2)

## 2017-08-06 LAB — PROTEIN / CREATININE RATIO, URINE
Creatinine, Urine: 120 mg/dL
PROTEIN CREATININE RATIO: 0.18 mg/mg{creat} — AB (ref 0.00–0.15)
TOTAL PROTEIN, URINE: 21 mg/dL

## 2017-08-06 LAB — CBC
HEMATOCRIT: 32.9 % — AB (ref 35.0–47.0)
HEMOGLOBIN: 10.9 g/dL — AB (ref 12.0–16.0)
MCH: 25.9 pg — ABNORMAL LOW (ref 26.0–34.0)
MCHC: 33.1 g/dL (ref 32.0–36.0)
MCV: 78.2 fL — ABNORMAL LOW (ref 80.0–100.0)
Platelets: 227 10*3/uL (ref 150–440)
RBC: 4.2 MIL/uL (ref 3.80–5.20)
RDW: 16.1 % — AB (ref 11.5–14.5)
WBC: 13.6 10*3/uL — AB (ref 3.6–11.0)

## 2017-08-06 LAB — TYPE AND SCREEN
ABO/RH(D): AB POS
ANTIBODY SCREEN: NEGATIVE

## 2017-08-06 LAB — GLUCOSE, CAPILLARY: Glucose-Capillary: 86 mg/dL (ref 65–99)

## 2017-08-06 MED ORDER — LIDOCAINE HCL (PF) 1 % IJ SOLN
INTRAMUSCULAR | Status: AC
  Filled 2017-08-06: qty 30

## 2017-08-06 MED ORDER — HYDRALAZINE HCL 20 MG/ML IJ SOLN: 10.0000 mg | Freq: Once | INTRAMUSCULAR | Status: DC | PRN

## 2017-08-06 MED ORDER — LABETALOL HCL 5 MG/ML IV SOLN
20.0000 mg | INTRAVENOUS | Status: DC | PRN
  Filled 2017-08-06: qty 16

## 2017-08-06 MED ORDER — ONDANSETRON HCL 4 MG/2ML IJ SOLN
4.0000 mg | Freq: Four times a day (QID) | INTRAMUSCULAR | Status: DC | PRN
  Administered 2017-08-07: 4 mg via INTRAVENOUS
  Filled 2017-08-06: qty 2

## 2017-08-06 MED ORDER — BUTORPHANOL TARTRATE 1 MG/ML IJ SOLN: 1.0000 mg | INTRAMUSCULAR | Status: DC | PRN

## 2017-08-06 MED ORDER — MISOPROSTOL 200 MCG PO TABS
ORAL_TABLET | ORAL | Status: AC
  Filled 2017-08-06: qty 4

## 2017-08-06 MED ORDER — AMMONIA AROMATIC IN INHA
RESPIRATORY_TRACT | Status: AC
  Filled 2017-08-06: qty 10

## 2017-08-06 MED ORDER — MISOPROSTOL 25 MCG QUARTER TABLET
ORAL_TABLET | ORAL | Status: AC
  Filled 2017-08-06: qty 1

## 2017-08-06 MED ORDER — OXYTOCIN 40 UNITS IN LACTATED RINGERS INFUSION - SIMPLE MED
2.5000 [IU]/h | INTRAVENOUS | Status: DC
  Filled 2017-08-06: qty 1000

## 2017-08-06 MED ORDER — OXYTOCIN BOLUS FROM INFUSION
500.0000 mL | Freq: Once | INTRAVENOUS | Status: AC
  Administered 2017-08-07: 500 mL via INTRAVENOUS

## 2017-08-06 MED ORDER — LACTATED RINGERS IV SOLN
500.0000 mL | INTRAVENOUS | Status: DC | PRN
  Administered 2017-08-07: 500 mL via INTRAVENOUS

## 2017-08-06 MED ORDER — TERBUTALINE SULFATE 1 MG/ML IJ SOLN: 0.2500 mg | Freq: Once | INTRAMUSCULAR | Status: DC | PRN

## 2017-08-06 MED ORDER — OXYTOCIN 10 UNIT/ML IJ SOLN
INTRAMUSCULAR | Status: AC
  Filled 2017-08-06: qty 2

## 2017-08-06 MED ORDER — ACETAMINOPHEN 325 MG PO TABS
650.0000 mg | ORAL_TABLET | ORAL | Status: DC | PRN
  Administered 2017-08-07 (×2): 650 mg via ORAL
  Filled 2017-08-06 (×2): qty 2

## 2017-08-06 MED ORDER — MISOPROSTOL 25 MCG QUARTER TABLET
25.0000 ug | ORAL_TABLET | ORAL | Status: DC | PRN
  Administered 2017-08-06 – 2017-08-07 (×3): 25 ug via VAGINAL
  Filled 2017-08-06 (×2): qty 1

## 2017-08-06 MED ORDER — LIDOCAINE HCL (PF) 1 % IJ SOLN: 30.0000 mL | INTRAMUSCULAR | Status: DC | PRN

## 2017-08-06 MED ORDER — LACTATED RINGERS IV SOLN
INTRAVENOUS | Status: DC
  Administered 2017-08-06 – 2017-08-07 (×2): via INTRAVENOUS
  Administered 2017-08-07 (×2): 1000 mL via INTRAVENOUS

## 2017-08-06 NOTE — OB Triage Note (Signed)
Pt states blood pressure has been elevated for the past week or so. Pt states pain level of 2/10 d/t a headache. Pt states she has infrequent headaches that varies from 5-35 minutes. Pt states headache will resolve with a tylenol. Pt states she has been experiencing visual disturbances such as floating black spots. Pt denies bloody show and leakage of fluids.

## 2017-08-06 NOTE — Progress Notes (Signed)
C/o pt states IOL needs to be sched today - was told not to go past due date d/t diabetes.rj

## 2017-08-06 NOTE — Progress Notes (Signed)
Routine Prenatal Care Visit  Subjective  Carolyn Rosales is a 31 y.o. G3P1001 at [redacted]w[redacted]d being seen today for ongoing prenatal care.  She is currently monitored for the following issues for this high-risk pregnancy and has Depression; Anxiety; Maternal obesity, antepartum; Supervision of high risk pregnancy, antepartum; Marijuana use; Herpes genitalis; History of abnormal cervical Pap smear; Substance abuse affecting pregnancy, antepartum; BMI 40.0-44.9, adult (HCC); and Gestational diabetes on her problem list.  ----------------------------------------------------------------------------------- Patient reports occasional contractions.   Contractions: Irregular. Vag. Bleeding: None.  Movement: Present. Denies leaking of fluid.  ----------------------------------------------------------------------------------- The following portions of the patient's history were reviewed and updated as appropriate: allergies, current medications, past family history, past medical history, past social history, past surgical history and problem list. Problem list updated.   Objective  Blood pressure (!) 144/78, weight 285 lb (129.3 kg), last menstrual period 11/17/2016, unknown if currently breastfeeding. Pregravid weight Pregravid weight not on file Total Weight Gain Not found. Urinalysis: Urine Protein: Trace Urine Glucose: Negative  Fetal Status: Fetal Heart Rate (bpm): 140   Movement: Present  Presentation: Vertex  General:  Alert, oriented and cooperative. Patient is in no acute distress.  Skin: Skin is warm and dry. No rash noted.   Cardiovascular: Normal heart rate noted  Respiratory: Normal respiratory effort, no problems with respiration noted  Abdomen: Soft, gravid, appropriate for gestational age. Pain/Pressure: Present     Pelvic:  Cervical exam performed Dilation: Closed Effacement (%): Thick Station: Ballotable  Extremities: Normal range of motion.     Mental Status: Normal mood  and affect. Normal behavior. Normal judgment and thought content.   NST:  Baseline: 140 Variability: moderate Accelerations: present Decelerations: absent Tocometry: none The patient was monitored for 30 minutes, fetal heart rate tracing was deemed reactive, category I tracing.  Assessment   31 y.o. G3P1001 at [redacted]w[redacted]d by  08/14/2017, Alternate EDD Entry presenting for routine prenatal visit.  Plan   SECOND Problems (from 07/24/17 to present)    Problem Noted Resolved   Gestational diabetes 07/15/2017 by Vena Austria, MD No   Overview Signed 07/15/2017  1:26 PM by Vena Austria, MD    Current Diabetic Medications:  None   Required Referrals for A1GDM or A2GDM: [X]  Diabetes Education and Testing Supplies [X]  Nutrition Cousult  For A2/B GDM or higher classes of DM [ ]  Diabetes Education and Testing Supplies [ ]  Nutrition Counsult [ ]  Fetal ECHO after 22-24 weeks  [ ]  Eye exam for retina evaluation  [ ]  Baseline EKG [ ]  US fetal growth every 4 weeks starting at 28 weeks [ ]  Twice weekly NST starting at [redacted] weeks gestation [ ]  Delivery planning contingent on fetal growth, AFI, glycemic control, and other co-morbidities but at least by 39 weeks  Baseline and surveillance labs (pulled in from [facility], refresh links as needed)  Lab Results  Component Value Date   CREATININE 0.50 05/25/2017   AST 21 05/25/2017   ALT 10 (L) 05/25/2017   TSH 3.41 12/28/2016   No results found for: HGBA1C  Antenatal Testing Class of DM U/S NST/AFI DELIVERY  Diabetes   A1 - good control - O24.410    A2 - good control - O24.419      A2  - poor control or poor compliance - O24.419, E11.65   (Macrosomia or polyhydramnios) **E11.65 is extra code for poor control**    A2/B - O24.919  and B-C O24.319  Poor control B-C or D-R-F-T - O24.319  or  Type I DM - O24.019  20-38  20-38  20-24-28-32-36   20-24-28-32-35-38//fetal echo  20-24-27-30-33-36-38//fetal echo  40  32//2 x  wk  32//2 x wk   32//2 x wk  28//BPP wkly then 32//2 x wk  40  39  PRN   39  PRN           Maternal obesity, antepartum 04/09/2017 by Farrel ConnersGutierrez, Colleen, CNM No   Overview Addendum 07/01/2017  9:37 AM by Conard NovakJackson, Stephen D, MD     BMI >=40 [ ]  early 1h gtt - not done [ x] u/s for dating   [X]  Growth u/s 28 [x] , 32 [ordered (34 wks) ], 36 weeks [ ]  [ ]  NST/AFI weekly 36+ weeks (36[] , 37[] , 38[] , 39[] , 40[] ) [ ]  IOL by 41 weeks (scheduled, prn [] )       Supervision of high risk pregnancy, antepartum 04/09/2017 by Farrel ConnersGutierrez, Colleen, CNM No   Overview Addendum 07/15/2017  1:28 PM by Vena AustriaStaebler, Andreas, MD    [facility] [place] Prenatal Labs  Dating LMP = 6w US Blood type: AB/Positive/-- (03/25 0000)   Genetic Screen NIPS: Normal XX Antibody:  neg  Anatomic US Completed 05/13/17 Rubella:   I Varicella:   GTT Early:               Third trimester: 169 GDM on 3-hr RPR: Nonreactive (03/25 0000)   Rhogam  HBsAg: Negative (03/25 0000)   TDaP vaccine                       Flu Shot: HIV: Non-reactive (03/25 0000)   Baby Food Breast/bottle                     GBS:   Contraception none Pap:NIL with POS HRHPV  CBB     CS/VBAC    Support Person                Advised patient to go to Labor and Delivery now for repeated elevated blood pressures at this visit: 150/70 and 144/78. NST reactive. AFI=10.58 on ultrasound today.   Patient did not bring log to this visit, but mentioned that BG values were "high" and 10/29 visit note from dietician states that fasting range was 113-126 and postprandial range was 107-160.  Discussed that patient needs IOL scheduled for no later than 40 weeks if she does not deliver during this admission to Labor and Delivery.  Marcelyn BruinsJacelyn Schmid, CNM 08/06/2017  2:53 PM

## 2017-08-06 NOTE — H&P (Signed)
OB History & Physical   History of Present Illness:  Chief Complaint: "my blood pressure was high in the office"  HPI:  Carolyn Rosales is a 31 y.o. G2P1001 female at 36w6ddated by LMP.  Her pregnancy has been complicated by history of HSV 2 and uncontrolled gestational diabetes diagnosed at 341 weeks She was unaware of diabetes diagnosis until 37 weeks due to a broken phone. She has been to an appointment with [facility] for diet and glucose monitoring training, but has only been checking her sugar for about 1 1/2 weeks. Her glucose values have generally been elevated above normal per her report. She has been taking valtrex daily since 37 weeks and denies any current herpes lesion.   [place] EDC was confirmed with a 6wk5d ultrasound (12/24/2016).  Her last pap smear was 12/24/2016 and was NIL with positive HRHPV. [place]. Her prenatal care has been remarkable for obesity (starting BMI 40) and depression (scored 24 on her EPDS at [facility]-was prescribed Buspar and Prozac, but has not taken medication). She has moved back to be near family and reports that it has really made her feel better being back home. She has been taking Benadryl for allergies and sleep, omeprazole and Tums for reflux and Flintstone gummie vitamins. She had cell free DNA testing which was negative for aneuploidy (XX). Her urine drug screen was positive for THC, but she reports no longer using MJ. Was using daily prior to positive pregnancy test. Other labs: AB POS, AbSc neg, HIV neg, HCV neg, HBSAG neg, RPR neg.  Her past medical history is remarkable for obesity, abnormal Pap smears, HSV II, stress induced asthma, and migraine headache.  Her prior pregnancies are notable for a SVD in 2007 [facility] 7#7oz [facility] Her postpartum course was remarkable for a spinal headache which was treated with a blood patch.  Growth  scan at 330w5daby at 57.7%, AC at 79%  She denies contractions.   She denies leakage of fluid.   She denies vaginal bleeding.   She reports fetal movement.    Maternal Medical History:   Past Medical History:  Diagnosis Date  . Anxiety   . Asthma    as a child  . Depression   . Gestational diabetes   . Herpes genitalis   . History of abnormal cervical Pap smear   . Obesity   . PTSD (post-traumatic stress disorder)   . PTSD (post-traumatic stress disorder)     Past Surgical History:  Procedure Laterality Date  . NO PAST SURGERIES      No Known Allergies  Prior to Admission medications   Medication Sig Start Date End Date Taking? Authorizing Provider  ACCU-CHEK FASTCLIX LANCETS MISC 1 Units by Percutaneous route 4 (four) times daily. 07/15/17  Yes StMalachy MoodMD  Blood Glucose Monitoring Suppl (ACCU-CHEK NANO SMARTVIEW) w/Device KIT 1 kit by Subdermal route as directed. Check blood sugars for fasting, and two hours after breakfast, lunch and dinner (4 checks daily) 07/15/17  Yes StMalachy MoodMD  glucose blood (ACCU-CHEK SMARTVIEW) test strip Use as instructed to check blood sugars 07/15/17  Yes StMalachy MoodMD  omeprazole (PRILOSEC) 20 MG capsule Take 1 capsule (20 mg total) by mouth daily. 07/01/17  Yes JaWill BonnetMD  Pediatric Multivit-Minerals-C ([facility]MI[facility] COMPLETE PO) Take 1 tablet by mouth daily.   Yes [provider]  valACYclovir (VALTREX) 500 MG tablet Take 1 tablet (500 mg total) by mouth 2 (two) times daily. 07/08/17 08/07/17 Yes Will Bonnet, MD  ferrous sulfate (FERROUSUL) 325 (65 FE) MG tablet Take 1 tablet (325 mg total) by mouth daily with breakfast. Patient not taking: Reported on 07/28/2017 05/29/17   Malachy Mood, MD  hydrocortisone (ANUSOL-HC) 2.5 % rectal cream Apply rectally 2 times daily Patient not taking: Reported on 07/28/2017 05/25/17 05/25/18  Schuyler Amor, MD  lidocaine (XYLOCAINE) 2 % solution  Tid to rectum, x 3 days Patient not taking: Reported on 07/28/2017 05/25/17   Schuyler Amor, MD    OB History  Gravida Para Term Preterm AB Living  2 1 1     1   SAB TAB Ectopic Multiple Live Births          1    # Outcome Date GA Lbr Len/2nd Weight Sex Delivery Anes PTL Lv  2 Current           1 Term 05/14/06 [redacted]w[redacted]d 7 lb 7 oz (3.374 kg) F Vag-Spont   LIV      Prenatal care site: [facility]  Social History: She  reports that she has quit smoking. She has never used smokeless tobacco. She reports that she does not drink alcohol or use drugs.  Family History: family history includes Breast cancer in her maternal aunt, paternal aunt, and paternal grandmother; Diabetes in her father; Diabetes Mellitus II in her mother; Hypertension in her mother; Multiple sclerosis in her mother.    Review of Systems: Negative x 10 systems reviewed except as noted in the HPI.    Physical Exam:  Vital Signs: BP (!) 152/82   Pulse 95   Temp 98.1 F (36.7 C) (Oral)   Resp 16   Ht 5' 6.5" (1.689 m)   Wt 284 lb (128.8 kg)   LMP 11/17/2016   BMI 45.15 kg/m  Constitutional: Obese, well developed female in no acute distress.  HEENT: normal Skin: Warm and dry.  Cardiovascular: Regular rate and rhythm.   Extremity: trace to 1+ edema  Respiratory: Clear to auscultation bilateral. Normal respiratory effort Abdomen: soft, nontender, nondistended, no abnormal masses, no epigastric pain and FHT present Back: no CVAT Neuro: DTRs 2+, Cranial nerves grossly intact Psych: Alert and Oriented x3. No memory deficits. Normal mood and affect.  MS: normal gait, normal bilateral lower extremity ROM/strength/stability.  Pelvic exam:  is not limited by body habitus EGBUS: within normal limits Vagina: within normal limits and with normal mucosa  Cervix: 1.5/60/-3  Results for GLELIANA, KONTZ(MRN [id number] as of 08/06/2017 18:56  Ref. Range 08/06/2017 13:46 08/06/2017 16:26 08/06/2017 16:42   COMPREHENSIVE METABOLIC PANEL Unknown   Rpt (A)  Sodium Latest Ref Range: 135 - 145 mmol/L   134 (L)  Potassium Latest Ref Range: 3.5 - 5.1 mmol/L   3.9  Chloride Latest Ref Range: 101 - 111 mmol/L   106  CO2 Latest Ref Range: 22 - 32 mmol/L   21 (L)  Glucose Latest Ref Range: 65 - 99 mg/dL   80  BUN Latest Ref Range: 6 - 20 mg/dL   11  Creatinine Latest Ref Range: 0.44 - 1.00 mg/dL   0.45  Calcium Latest Ref Range: 8.9 - 10.3 mg/dL   9.1  Anion gap Latest Ref Range: 5 - 15    7  Alkaline Phosphatase Latest Ref Range: 38 - 126 U/L   157 (H)  Albumin  Latest Ref Range: 3.5 - 5.0 g/dL   2.8 (L)  AST Latest Ref Range: 15 - 41 U/L   19  ALT Latest Ref Range: 14 - 54 U/L   14  Total Protein Latest Ref Range: 6.5 - 8.1 g/dL   6.9  Total Bilirubin Latest Ref Range: 0.3 - 1.2 mg/dL   0.5  GFR, Est Non African American Latest Ref Range: >60 mL/min   >60  GFR, Est African American Latest Ref Range: >60 mL/min   >60  WBC Latest Ref Range: 3.6 - 11.0 K/uL   13.6 (H)  RBC Latest Ref Range: 3.80 - 5.20 MIL/uL   4.20  Hemoglobin Latest Ref Range: 12.0 - 16.0 g/dL   10.9 (L)  HCT Latest Ref Range: 35.0 - 47.0 %   32.9 (L)  MCV Latest Ref Range: 80.0 - 100.0 fL   78.2 (L)  MCH Latest Ref Range: 26.0 - 34.0 pg   25.9 (L)  MCHC Latest Ref Range: 32.0 - 36.0 g/dL   33.1  RDW Latest Ref Range: 11.5 - 14.5 %   16.1 (H)  Platelets Latest Ref Range: 150 - 440 K/uL   227  Total Protein, Urine Latest Units: mg/dL  21   Protein Creatinine Ratio Latest Ref Range: 0.00 - 0.15 mg/mgCre  0.18 (H)   Creatinine, Urine Latest Units: mg/dL  120   US OB LIMITED Unknown Rpt       Pertinent Results:  Prenatal Labs: Blood type/Rh AB positive  Antibody screen negative  Rubella Unknown  Varicella Unknown    RPR Non-reactive  Hep B negative  HIV negative  GC negative  Chlamydia negative  Genetic screening Cell free dna negative  1 hour GTT 169 on 8/23  3 hour GTT 2/4 levels abnormal on 10/3  GBS positive on  10/19   Baseline FHR: 140 beats/min   Variability: moderate   Accelerations: present   Decelerations: absent Contractions: present frequency: every 3-6 Overall assessment: reassuring    Assessment:  Letica Giaimo is a 31 y.o. G1P1001 female at 79w6dwith induction of labor for gestational hypertension, gestational diabetes not well controlled.   Plan:  1. Admit to Labor & Delivery for induction of labor 2. CBC, T&S, Clrs, IVF, MMR and Varicella immunity labs 3. GBS positive: penicillin prophylaxis beginning in active labor   4. Fetal well-being: Category I 5. Labetalol protocol for severe range blood pressure 6. Blood glucose monitoring every 4 hours in early labor and every 2 hours in active labor 7. Sliding scale insulin as needed- target range 70-126 mg/dl 8. Cytotec to begin induction  [facility]od [facility]11/10/2016 6:43 PM

## 2017-08-07 ENCOUNTER — Inpatient Hospital Stay: Payer: 59 | Admitting: Anesthesiology

## 2017-08-07 ENCOUNTER — Encounter: Payer: Self-pay | Admitting: Anesthesiology

## 2017-08-07 DIAGNOSIS — O134 Gestational [pregnancy-induced] hypertension without significant proteinuria, complicating childbirth: Secondary | ICD-10-CM

## 2017-08-07 DIAGNOSIS — Z3A38 38 weeks gestation of pregnancy: Secondary | ICD-10-CM

## 2017-08-07 LAB — GLUCOSE, CAPILLARY
GLUCOSE-CAPILLARY: 101 mg/dL — AB (ref 65–99)
GLUCOSE-CAPILLARY: 128 mg/dL — AB (ref 65–99)
Glucose-Capillary: 179 mg/dL — ABNORMAL HIGH (ref 65–99)
Glucose-Capillary: 79 mg/dL (ref 65–99)
Glucose-Capillary: 88 mg/dL (ref 65–99)

## 2017-08-07 LAB — URINE DRUG SCREEN, QUALITATIVE (ARMC ONLY)
Amphetamines, Ur Screen: NOT DETECTED
BARBITURATES, UR SCREEN: NOT DETECTED
Benzodiazepine, Ur Scrn: NOT DETECTED
CANNABINOID 50 NG, UR ~~LOC~~: POSITIVE — AB
COCAINE METABOLITE, UR ~~LOC~~: NOT DETECTED
MDMA (Ecstasy)Ur Screen: NOT DETECTED
Methadone Scn, Ur: NOT DETECTED
OPIATE, UR SCREEN: NOT DETECTED
Phencyclidine (PCP) Ur S: NOT DETECTED
TRICYCLIC, UR SCREEN: NOT DETECTED

## 2017-08-07 MED ORDER — PRENATAL MULTIVITAMIN CH
1.0000 | ORAL_TABLET | Freq: Every day | ORAL | Status: DC
  Administered 2017-08-08 – 2017-08-09 (×2): 1 via ORAL
  Filled 2017-08-07 (×2): qty 1

## 2017-08-07 MED ORDER — LIDOCAINE HCL (PF) 1 % IJ SOLN
INTRAMUSCULAR | Status: DC | PRN
  Administered 2017-08-07: 5 mL via SUBCUTANEOUS
  Administered 2017-08-07: 3 mL via SUBCUTANEOUS

## 2017-08-07 MED ORDER — PENICILLIN G POTASSIUM 5000000 UNITS IJ SOLR
5.0000 10*6.[IU] | Freq: Once | INTRAVENOUS | Status: AC
  Administered 2017-08-07: 5 10*6.[IU] via INTRAVENOUS
  Filled 2017-08-07: qty 5

## 2017-08-07 MED ORDER — DIPHENHYDRAMINE HCL 50 MG/ML IJ SOLN: 12.5000 mg | INTRAMUSCULAR | Status: DC | PRN

## 2017-08-07 MED ORDER — WITCH HAZEL-GLYCERIN EX PADS: 1.0000 "application " | MEDICATED_PAD | CUTANEOUS | Status: DC | PRN

## 2017-08-07 MED ORDER — ONDANSETRON HCL 4 MG/2ML IJ SOLN
4.0000 mg | INTRAMUSCULAR | Status: DC | PRN
Start: 2017-08-07 — End: 2017-08-09

## 2017-08-07 MED ORDER — TERBUTALINE SULFATE 1 MG/ML IJ SOLN: 0.2500 mg | Freq: Once | INTRAMUSCULAR | Status: DC | PRN

## 2017-08-07 MED ORDER — PHENYLEPHRINE 40 MCG/ML (10ML) SYRINGE FOR IV PUSH (FOR BLOOD PRESSURE SUPPORT)
80.0000 ug | PREFILLED_SYRINGE | INTRAVENOUS | Status: DC | PRN
  Filled 2017-08-07: qty 5

## 2017-08-07 MED ORDER — SODIUM CHLORIDE 0.9 % IV SOLN
1.0000 g | INTRAVENOUS | Status: DC
  Administered 2017-08-07: 1 g via INTRAVENOUS
  Filled 2017-08-07 (×5): qty 1000

## 2017-08-07 MED ORDER — OXYCODONE-ACETAMINOPHEN 5-325 MG PO TABS
2.0000 | ORAL_TABLET | ORAL | Status: DC | PRN
  Administered 2017-08-08 – 2017-08-09 (×5): 2 via ORAL
  Filled 2017-08-07 (×6): qty 2

## 2017-08-07 MED ORDER — CALCIUM CARBONATE ANTACID 500 MG PO CHEW
CHEWABLE_TABLET | ORAL | Status: AC
  Filled 2017-08-07: qty 2

## 2017-08-07 MED ORDER — LIDOCAINE-EPINEPHRINE (PF) 1.5 %-1:200000 IJ SOLN
INTRAMUSCULAR | Status: DC | PRN
  Administered 2017-08-07 (×2): 3 mL via EPIDURAL

## 2017-08-07 MED ORDER — DIBUCAINE 1 % RE OINT: 1.0000 "application " | TOPICAL_OINTMENT | RECTAL | Status: DC | PRN

## 2017-08-07 MED ORDER — FENTANYL 2.5 MCG/ML W/ROPIVACAINE 0.15% IN NS 100 ML EPIDURAL (ARMC)
EPIDURAL | Status: AC
  Filled 2017-08-07: qty 100

## 2017-08-07 MED ORDER — COCONUT OIL OIL
1.0000 "application " | TOPICAL_OIL | Status: DC | PRN
  Administered 2017-08-09: 1 via TOPICAL
  Filled 2017-08-07: qty 120

## 2017-08-07 MED ORDER — IBUPROFEN 600 MG PO TABS
600.0000 mg | ORAL_TABLET | Freq: Four times a day (QID) | ORAL | Status: DC
  Administered 2017-08-08 – 2017-08-09 (×7): 600 mg via ORAL
  Filled 2017-08-07 (×8): qty 1

## 2017-08-07 MED ORDER — PENICILLIN G POTASSIUM 5000000 UNITS IJ SOLR
2.5000 10*6.[IU] | INTRAVENOUS | Status: DC
  Filled 2017-08-07 (×4): qty 2.5

## 2017-08-07 MED ORDER — LACTATED RINGERS IV SOLN: 500.0000 mL | Freq: Once | INTRAVENOUS | Status: DC

## 2017-08-07 MED ORDER — BENZOCAINE-MENTHOL 20-0.5 % EX AERO: 1.0000 "application " | INHALATION_SPRAY | CUTANEOUS | Status: DC | PRN

## 2017-08-07 MED ORDER — ACETAMINOPHEN 325 MG PO TABS: 650.0000 mg | ORAL_TABLET | ORAL | Status: DC | PRN

## 2017-08-07 MED ORDER — SIMETHICONE 80 MG PO CHEW
80.0000 mg | CHEWABLE_TABLET | ORAL | Status: DC | PRN
Start: 2017-08-07 — End: 2017-08-09

## 2017-08-07 MED ORDER — EPHEDRINE 5 MG/ML INJ
10.0000 mg | INTRAVENOUS | Status: DC | PRN
  Filled 2017-08-07: qty 2

## 2017-08-07 MED ORDER — OXYTOCIN 40 UNITS IN LACTATED RINGERS INFUSION - SIMPLE MED
1.0000 m[IU]/min | INTRAVENOUS | Status: DC
  Administered 2017-08-07: 2 m[IU]/min via INTRAVENOUS

## 2017-08-07 MED ORDER — VALACYCLOVIR HCL 500 MG PO TABS
500.0000 mg | ORAL_TABLET | Freq: Two times a day (BID) | ORAL | Status: DC
  Administered 2017-08-07: 500 mg via ORAL
  Filled 2017-08-07 (×3): qty 1

## 2017-08-07 MED ORDER — SENNOSIDES-DOCUSATE SODIUM 8.6-50 MG PO TABS
2.0000 | ORAL_TABLET | ORAL | Status: DC
  Administered 2017-08-08 (×2): 2 via ORAL
  Filled 2017-08-07 (×2): qty 2

## 2017-08-07 MED ORDER — FENTANYL 2.5 MCG/ML W/ROPIVACAINE 0.15% IN NS 100 ML EPIDURAL (ARMC)
12.0000 mL/h | EPIDURAL | Status: DC
  Administered 2017-08-07 (×2): 12 mL/h via EPIDURAL
  Filled 2017-08-07 (×2): qty 100

## 2017-08-07 MED ORDER — PENICILLIN G POT IN DEXTROSE 60000 UNIT/ML IV SOLN
3.0000 10*6.[IU] | INTRAVENOUS | Status: DC
  Administered 2017-08-07: 3 10*6.[IU] via INTRAVENOUS
  Filled 2017-08-07 (×4): qty 50

## 2017-08-07 MED ORDER — SODIUM CHLORIDE 0.9 % IV SOLN
INTRAVENOUS | Status: DC | PRN
  Administered 2017-08-07 (×2): 5 mL via EPIDURAL

## 2017-08-07 MED ORDER — TETANUS-DIPHTH-ACELL PERTUSSIS 5-2.5-18.5 LF-MCG/0.5 IM SUSP
0.5000 mL | Freq: Once | INTRAMUSCULAR | Status: AC
  Administered 2017-08-09: 0.5 mL via INTRAMUSCULAR
  Filled 2017-08-07: qty 0.5

## 2017-08-07 MED ORDER — CALCIUM CARBONATE ANTACID 500 MG PO CHEW
400.0000 mg | CHEWABLE_TABLET | Freq: Once | ORAL | Status: AC
  Administered 2017-08-07: 400 mg via ORAL

## 2017-08-07 MED ORDER — OXYCODONE-ACETAMINOPHEN 5-325 MG PO TABS
1.0000 | ORAL_TABLET | ORAL | Status: DC | PRN
  Administered 2017-08-09: 1 via ORAL

## 2017-08-07 MED ORDER — ONDANSETRON HCL 4 MG PO TABS: 4.0000 mg | ORAL_TABLET | ORAL | Status: DC | PRN

## 2017-08-07 MED ORDER — DIPHENHYDRAMINE HCL 25 MG PO CAPS: 25.0000 mg | ORAL_CAPSULE | Freq: Four times a day (QID) | ORAL | Status: DC | PRN

## 2017-08-07 MED ORDER — BUPIVACAINE HCL (PF) 0.25 % IJ SOLN
INTRAMUSCULAR | Status: DC | PRN
  Administered 2017-08-07 (×2): 5 mL via EPIDURAL

## 2017-08-07 NOTE — Progress Notes (Signed)
  Labor Progress Note   31 y.o. G2P1001 @ 6312w0d , admitted for  Pregnancy, Labor Management. IOL for gestational hypertension, uncontrolled GDM  Subjective:  Patient has been sleeping on and off, feeling occasional contractions, having some right arm numbness/tingling. Discussion of plan of care with patient with verbalized understanding.   Objective:  BP (!) 154/80   Pulse 88   Temp (!) 97.3 F (36.3 C) (Axillary)   Resp 16   Ht 5' 6.5" (1.689 m)   Wt 284 lb (128.8 kg)   LMP 11/17/2016   BMI 45.15 kg/m  Abd: mild Extr: trace to 1+ bilateral pedal edema SVE: CERVIX: 3.5 cm dilated, 70 effaced, -2 station, closer to midline, cervix swept  EFM: FHR: 130 bpm, variability: moderate,  accelerations:  Present,  decelerations:  Absent Toco: Frequency: Every 3-5 minutes Labs: I have reviewed the patient's lab results.   Assessment & Plan:  G2P1001 @ 6912w0d, admitted for  Pregnancy and Labor/Delivery Management  1. Pain management: none. 2. FWB: FHT category I.  3. ID: GBS positive 4. Labor management: light breakfast and then start pitocin 5. Blood glucose checks 6. Antihypertensive therapy PRN  All discussed with patient, see orders  Carolyn Rosales, CNM  [facility], [facility] 08/07/2017  7:35 AM

## 2017-08-07 NOTE — Anesthesia Procedure Notes (Signed)
Epidural Patient location during procedure: OB Start time: 08/07/2017 1:01 PM End time: 08/07/2017 1:15 PM  Staffing Anesthesiologist: Lenard SimmerKARENZ, ANDREW Resident/CRNA: Karoline CaldwellSTARR, Olubunmi Rothenberger Performed: resident/CRNA   Preanesthetic Checklist Completed: patient identified, site marked, surgical consent, pre-op evaluation, timeout performed, IV checked, risks and benefits discussed and monitors and equipment checked  Epidural Patient position: sitting Prep: Betadine Patient monitoring: heart rate, continuous pulse ox and blood pressure Approach: midline Location: L4-L5 Injection technique: LOR saline  Needle:  Needle type: Tuohy  Needle gauge: 17 G Needle length: 9 cm and 9 Needle insertion depth: 9 cm Catheter type: closed end flexible Catheter size: 19 Gauge Catheter at skin depth: 14 cm Test dose: negative and 1.5% lidocaine with Epi 1:200 K  Assessment Events: blood not aspirated, injection not painful, no injection resistance, negative IV test and no paresthesia  Additional Notes Pt. Evaluated and documentation done after procedure finished. Patient identified. Risks/Benefits/Options discussed with patient including but not limited to bleeding, infection, nerve damage, paralysis, failed block, incomplete pain control, headache, blood pressure changes, nausea, vomiting, reactions to medication both or allergic, itching and postpartum back pain. Confirmed with bedside nurse the patient's most recent platelet count. Confirmed with patient that they are not currently taking any anticoagulation, have any bleeding history or any family history of bleeding disorders. Patient expressed understanding and wished to proceed. All questions were answered. Sterile technique was used throughout the entire procedure. Please see nursing notes for vital signs. Test dose was given through epidural catheter and negative prior to continuing to dose epidural or start infusion. Warning signs of high block given to  the patient including shortness of breath, tingling/numbness in hands, complete motor block, or any concerning symptoms with instructions to call for help. Patient was given instructions on fall risk and not to get out of bed. All questions and concerns addressed with instructions to call with any issues or inadequate analgesia.   Patient tolerated the insertion well without immediate complications.Reason for block:procedure for pain

## 2017-08-07 NOTE — Anesthesia Procedure Notes (Addendum)
Epidural Patient location during procedure: OB Start time: 08/07/2017 10:27 AM End time: 08/07/2017 10:42 AM  Staffing Anesthesiologist: Lenard SimmerKARENZ, Romney Compean Performed: anesthesiologist   Preanesthetic Checklist Completed: patient identified, site marked, surgical consent, pre-op evaluation, timeout performed, IV checked, risks and benefits discussed and monitors and equipment checked  Epidural Patient position: sitting Prep: ChloraPrep Patient monitoring: heart rate, continuous pulse ox and blood pressure Approach: midline Location: L4-L5 Injection technique: LOR saline  Needle:  Needle type: Tuohy  Needle gauge: 17 G Needle length: 9 cm and 9 Needle insertion depth: 7 cm Catheter type: closed end flexible Catheter size: 19 Gauge Catheter at skin depth: 12 cm Test dose: negative and 1.5% lidocaine with Epi 1:200 K  Assessment Sensory level: T10 Events: blood not aspirated, injection not painful, no injection resistance, negative IV test and no paresthesia  Additional Notes Pt. Evaluated and documentation done after procedure finished. Patient identified. Risks/Benefits/Options discussed with patient including but not limited to bleeding, infection, nerve damage, paralysis, failed block, incomplete pain control, headache, blood pressure changes, nausea, vomiting, reactions to medication both or allergic, itching and postpartum back pain. Confirmed with bedside nurse the patient's most recent platelet count. Confirmed with patient that they are not currently taking any anticoagulation, have any bleeding history or any family history of bleeding disorders. Patient expressed understanding and wished to proceed. All questions were answered. Sterile technique was used throughout the entire procedure. Please see nursing notes for vital signs. Test dose was given through epidural catheter and negative prior to continuing to dose epidural or start infusion. Warning signs of high block given to  the patient including shortness of breath, tingling/numbness in hands, complete motor block, or any concerning symptoms with instructions to call for help. Patient was given instructions on fall risk and not to get out of bed. All questions and concerns addressed with instructions to call with any issues or inadequate analgesia.   Patient tolerated the insertion well without immediate complications.Reason for block:procedure for pain

## 2017-08-07 NOTE — Discharge Summary (Signed)
Obstetric Discharge Summary Reason for Admission: induction of labor and GHTN, GDM Prenatal Procedures: none  Intrapartum Procedures: spontaneous vaginal delivery Postpartum Procedures: none Complications-Operative and Postpartum: 1st degree perineal laceration Hemoglobin  Date Value Ref Range Status  08/08/2017 9.6 (L) 12.0 - 16.0 g/dL Final  05/28/2017 10.4 (L) 11.1 - 15.9 g/dL Final   HCT  Date Value Ref Range Status  08/08/2017 29.5 (L) 35.0 - 47.0 % Final   Hematocrit  Date Value Ref Range Status  05/28/2017 33.4 (L) 34.0 - 46.6 % Final    Physical Exam:  General: alert, cooperative, appears stated age and no distress Lochia: appropriate Uterine Fundus: firm Laceration: healing well DVT Evaluation: No evidence of DVT seen on physical exam.  Discharge Diagnoses: Term Pregnancy-delivered  Discharge Information: Date: 08/09/2017 Activity: pelvic rest Diet: routine Allergies as of 08/09/2017   No Known Allergies     Medication List    STOP taking these medications   ACCU-CHEK FASTCLIX LANCETS Misc   ACCU-CHEK NANO SMARTVIEW w/Device Kit   FLINT[employer]ONES GUMMIES COMPLETE PO   glucose blood test strip Commonly known as:  ACCU-CHEK SMARTVIEW   hydrocortisone 2.5 % rectal cream Commonly known as:  ANUSOL-HC   lidocaine 2 % solution Commonly known as:  XYLOCAINE   omeprazole 20 MG capsule Commonly known as:  PRILOSEC   valACYclovir 500 MG tablet Commonly known as:  VALTREX     TAKE these medications   ferrous sulfate 325 (65 FE) MG tablet Commonly known as:  FERROUSUL Take 1 tablet (325 mg total) 2 (two) times daily with a meal by mouth. What changed:  when to take this       Condition: stable Discharge to: home Follow-up Information    Malachy Mood, MD Follow up in 1 week(s).   Specialty:  Obstetrics and Gynecology Why:  BP Check Contact information: [address] [place] [address] [phone]           Newborn  Data: Live born female  Birth Weight: 8 lb 1.5 oz (3670 g) APGAR: 49, 9  Newborn Delivery   Birth date/time:  08/07/2017 21:07:00 Delivery type:  Vaginal, Spontaneous Delivery      Home with mother.  Carolyn Rosales 08/09/2017, 10:42 AM

## 2017-08-07 NOTE — Progress Notes (Addendum)
   Subjective:  Does report having a headache that developed 30 minutes or so ago.  No severe, no vision changes.  Comfortable with epidural  Objective:   Vitals: Blood pressure (!) 143/76, pulse 84, temperature 98.2 F (36.8 C), temperature source Oral, resp. rate 18, height 5' 6.5" (1.689 m), weight 284 lb (128.8 kg), last menstrual period 11/17/2016, SpO2 99 %, unknown if currently breastfeeding. General: NAD Abdomen: Gravid, non-tender Cervical Exam:  Dilation: 4 Effacement (%): 70 Cervical Position: Anterior Station: -3 Presentation: Vertex Exam by:: Bonney AidStaebler, A. MD  FHT: 140, moderate, positive accelerations, intermittent late deceleration Toco:q752min  Results for orders placed or performed during the hospital encounter of 08/06/17 (from the past 24 hour(s))  Glucose, capillary     Status: None   Collection Time: 08/06/17  8:42 PM  Result Value Ref Range   Glucose-Capillary 86 65 - 99 mg/dL  Glucose, capillary     Status: None   Collection Time: 08/07/17 12:36 AM  Result Value Ref Range   Glucose-Capillary 79 65 - 99 mg/dL  Glucose, capillary     Status: None   Collection Time: 08/07/17  4:50 AM  Result Value Ref Range   Glucose-Capillary 88 65 - 99 mg/dL  Urine Drug Screen, Qualitative ([facility] only)     Status: Abnormal   Collection Time: 08/07/17  9:10 AM  Result Value Ref Range   Tricyclic, Ur Screen NONE DETECTED NONE DETECTED   Amphetamines, Ur Screen NONE DETECTED NONE DETECTED   MDMA (Ecstasy)Ur Screen NONE DETECTED NONE DETECTED   Cocaine Metabolite,Ur [place] NONE DETECTED NONE DETECTED   Opiate, Ur Screen NONE DETECTED NONE DETECTED   Phencyclidine (PCP) Ur S NONE DETECTED NONE DETECTED   Cannabinoid 50 Ng, Ur [place] POSITIVE (A) NONE DETECTED   Barbiturates, Ur Screen NONE DETECTED NONE DETECTED   Benzodiazepine, Ur Scrn NONE DETECTED NONE DETECTED   Methadone Scn, Ur NONE DETECTED NONE DETECTED  Glucose, capillary     Status: Abnormal   Collection Time: 08/07/17   9:13 AM  Result Value Ref Range   Glucose-Capillary 179 (H) 65 - 99 mg/dL  Glucose, capillary     Status: Abnormal   Collection Time: 08/07/17  1:24 PM  Result Value Ref Range   Glucose-Capillary 101 (H) 65 - 99 mg/dL    Assessment:   31 y.o. G2P1001 [redacted]w[redacted]d IOL GHTN and poorly controlled/compliant GDM  Plan:   1) Labor  - continue pitocin - AROM clear  2) Fetus - cat II tracing - has had intermittent late deceleration, clear amniotic fluid, positive fetal scalp stimulation - we discussed that at present everything points toward the fetus oxygenating appropriately, however, as labor progress if fetal heart rate changes occur which are concerning for fetal compromise my recommendation would be to proceed with LTCS when and if these occur.    3) GHTN - normal labs and mild range BP.  Tylenol po for headache, if fails to improve after tylenol administration discussed magnesium sulfate and change in diagnosis to preeclampsia with severe features based on neurologic symptoms

## 2017-08-07 NOTE — Progress Notes (Signed)
  Labor Progress Note   31 y.o. G2P1001 @ 7358w0d , admitted for Pregnancy, Labor Management. IOL for gestational hypertension and uncontrolled GDM.  Subjective:  Sitting up in bed, getting ready to have clear liquid lunch tray. She verbalizes that the epidural is not working as well as she expected, and she is still feeling contractions and back pain. Anesthesia aware via RN and will re-evaluate.  Objective:  BP 133/86 (BP Location: Right Arm)   Pulse 90   Temp 98.5 F (36.9 C) (Oral)   Resp 18   Ht 5' 6.5" (1.689 m)   Wt 284 lb (128.8 kg)   LMP 11/17/2016   SpO2 98%   BMI 45.15 kg/m  Abd: mild Extr: trace to 1+ bilateral pedal edema SVE: deferred, will recheck when patient done with lunch  EFM: FHR: 135 bpm baseline, variability: moderate, accelerations: present, decelerations: absent Toco: Frequency: Every 3-5 minutes, Duration: 50-70 seconds and Intensity: mild Labs: I have reviewed the patient's lab results.   Assessment & Plan:  G2P1001 @ 3058w0d, admitted for Pregnancy and Labor/Delivery Management, IOL.  1. Pain management: epidural. 2. FWB: EFM category I.  3. ID: GBS positive, antibiotics begun. 4. Labor management: continue Pitocin. 5. Blood glucose monitoring as ordered. 6. Monitor BP and treat HTN as needed.  All discussed with patient, see orders.  Marcelyn BruinsJacelyn Schmid, CNM 08/07/2017  12:41 PM

## 2017-08-07 NOTE — Anesthesia Preprocedure Evaluation (Addendum)
Anesthesia Evaluation  Patient identified by MRN, date of birth, ID band Patient awake    Reviewed: Allergy & Precautions, H&P , NPO status , Patient's Chart, lab work & pertinent test results, reviewed documented beta blocker date and time   History of Anesthesia Complications Negative for: history of anesthetic complications  Airway Mallampati: III  TM Distance: >3 FB Neck ROM: full    Dental no notable dental hx.    Pulmonary neg shortness of breath, asthma (as a child) , neg recent URI, former smoker,           Cardiovascular Exercise Tolerance: Good hypertension,      Neuro/Psych PSYCHIATRIC DISORDERS negative neurological ROS     GI/Hepatic Neg liver ROS, GERD  ,  Endo/Other  diabetes, GestationalMorbid obesity  Renal/GU negative Renal ROS  negative genitourinary   Musculoskeletal   Abdominal   Peds  Hematology negative hematology ROS (+)   Anesthesia Other Findings Past Medical History: No date: Anxiety No date: Asthma     Comment:  as a child No date: Depression No date: Gestational diabetes No date: Herpes genitalis No date: History of abnormal cervical Pap smear No date: Obesity No date: PTSD (post-traumatic stress disorder) No date: PTSD (post-traumatic stress disorder)   Reproductive/Obstetrics (+) Pregnancy                            Anesthesia Physical Anesthesia Plan  ASA: III  Anesthesia Plan: Epidural   Post-op Pain Management:    Induction:   PONV Risk Score and Plan:   Airway Management Planned:   Additional Equipment:   Intra-op Plan:   Post-operative Plan:   Informed Consent: I have reviewed the patients History and Physical, chart, labs and discussed the procedure including the risks, benefits and alternatives for the proposed anesthesia with the patient or authorized representative who has indicated his/her understanding and acceptance.    Dental Advisory Given  Plan Discussed with: Anesthesiologist, CRNA and Surgeon  Anesthesia Plan Comments:         Anesthesia Quick Evaluation

## 2017-08-08 LAB — CBC
HEMATOCRIT: 29.5 % — AB (ref 35.0–47.0)
Hemoglobin: 9.6 g/dL — ABNORMAL LOW (ref 12.0–16.0)
MCH: 25.5 pg — ABNORMAL LOW (ref 26.0–34.0)
MCHC: 32.4 g/dL (ref 32.0–36.0)
MCV: 78.7 fL — ABNORMAL LOW (ref 80.0–100.0)
PLATELETS: 216 10*3/uL (ref 150–440)
RBC: 3.75 MIL/uL — AB (ref 3.80–5.20)
RDW: 16.1 % — AB (ref 11.5–14.5)
WBC: 13.1 10*3/uL — AB (ref 3.6–11.0)

## 2017-08-08 LAB — VARICELLA ZOSTER ANTIBODY, IGG: VARICELLA IGG: 1151 {index} (ref >165)

## 2017-08-08 LAB — MEASLES/MUMPS/RUBELLA IMMUNITY
Mumps IgG: <9 AU/mL — ABNORMAL LOW (ref >10.9)
RUBELLA: 1.28 {index} (ref >0.99)
RUBEOLA IGG: 153 [AU]/ml (ref >29.9)

## 2017-08-08 LAB — RPR: RPR Ser Ql: NONREACTIVE

## 2017-08-08 NOTE — Anesthesia Postprocedure Evaluation (Signed)
Anesthesia Post Note  Patient: Carolyn Rosales  Procedure(s) Performed: AN AD HOC LABOR EPIDURAL  Patient location during evaluation: Mother Baby Anesthesia Type: Epidural Level of consciousness: awake and alert and oriented Pain management: pain level controlled Vital Signs Assessment: post-procedure vital signs reviewed and stable Respiratory status: spontaneous breathing Cardiovascular status: blood pressure returned to baseline Postop Assessment: no headache and patient able to bend at knees Anesthetic complications: no Comments: Patient doing well. Mild backache, but patient reassured.     Last Vitals:  Vitals:   08/08/17 0340 08/08/17 0728  BP: 135/81 140/76  Pulse: 80 81  Resp: 16 18  Temp: 36.8 C 36.7 C  SpO2: 99% 100%    Last Pain:  Vitals:   08/08/17 0730  TempSrc:   PainSc: 3                  Shatasia Cutshaw

## 2017-08-08 NOTE — Progress Notes (Signed)
Subjective:  Resting in bed with newborn skin-to-skin. Fatigued, and having some afterpains, back pain, and perineal discomfort. Plans to try oxycodone for better pain control after breakfast. Voiding without difficulty. Ambulating well.  Objective:   Blood pressure 140/76, pulse 81, temperature 98 F (36.7 C), temperature source Oral, resp. rate 18, height 5' 6.5" (1.689 m), weight 284 lb (128.8 kg), last menstrual period 11/17/2016, SpO2 100%, currently breastfeeding.  General: NAD Pulmonary: no increased work of breathing Abdomen: non-distended, non-tender Uterus:  fundus firm at U; lochia rubra small Laceration: well approximated Extremities: trace edema, no erythema, no tenderness, no signs of DVT  Results for orders placed or performed during the hospital encounter of 08/06/17 (from the past 72 hour(s))  Protein / creatinine ratio, urine     Status: Abnormal   Collection Time: 08/06/17  4:26 PM  Result Value Ref Range   Creatinine, Urine 120 mg/dL   Total Protein, Urine 21 mg/dL    Comment: NO NORMAL RANGE ESTABLISHED FOR THIS TEST   Protein Creatinine Ratio 0.18 (H) 0.00 - 0.15 mg/mg[Cre]  Type and screen [place]     Status: None   Collection Time: 08/06/17  4:26 PM  Result Value Ref Range   ABO/RH(D) AB POS    Antibody Screen NEG    Sample Expiration 08/09/2017   RPR     Status: None   Collection Time: 08/06/17  4:26 PM  Result Value Ref Range   RPR Ser Ql Non Reactive Non Reactive    Comment: (NOTE) Performed At: [facility] [place], [place] [id number] Rush Farmer MD SP:[id number]   CBC     Status: Abnormal   Collection Time: 08/06/17  4:42 PM  Result Value Ref Range   WBC 13.6 (H) 3.6 - 11.0 K/uL   RBC 4.20 3.80 - 5.20 MIL/uL   Hemoglobin 10.9 (L) 12.0 - 16.0 g/dL   HCT 32.9 (L) 35.0 - 47.0 %   MCV 78.2 (L) 80.0 - 100.0 fL   MCH 25.9 (L) 26.0 - 34.0 pg   MCHC 33.1 32.0 - 36.0 g/dL   RDW 16.1 (H) 11.5 -  14.5 %   Platelets 227 150 - 440 K/uL  Comprehensive metabolic panel     Status: Abnormal   Collection Time: 08/06/17  4:42 PM  Result Value Ref Range   Sodium 134 (L) 135 - 145 mmol/L   Potassium 3.9 3.5 - 5.1 mmol/L   Chloride 106 101 - 111 mmol/L   CO2 21 (L) 22 - 32 mmol/L   Glucose, Bld 80 65 - 99 mg/dL   BUN 11 6 - 20 mg/dL   Creatinine, Ser 0.45 0.44 - 1.00 mg/dL   Calcium 9.1 8.9 - 10.3 mg/dL   Total Protein 6.9 6.5 - 8.1 g/dL   Albumin 2.8 (L) 3.5 - 5.0 g/dL   AST 19 15 - 41 U/L   ALT 14 14 - 54 U/L   Alkaline Phosphatase 157 (H) 38 - 126 U/L   Total Bilirubin 0.5 0.3 - 1.2 mg/dL   GFR calc non Af Amer >60 >60 mL/min   GFR calc Af Amer >60 >60 mL/min    Comment: (NOTE) The eGFR has been calculated using the CKD EPI equation. This calculation has not been validated in all clinical situations. eGFR's persistently <60 mL/min signify possible Chronic Kidney Disease.    Anion gap 7 5 - 15  Glucose, capillary     Status: None   Collection Time: 08/06/17  8:42 PM  Result Value Ref Range   Glucose-Capillary 86 65 - 99 mg/dL  Measles/Mumps/Rubella Immunity     Status: Abnormal   Collection Time: 08/06/17  8:44 PM  Result Value Ref Range   Rubella 1.28 Immune >0.99 index    Comment: (NOTE)                                Non-immune       <0.90                                Equivocal  0.90 - 0.99                                Immune           >0.99    Rubeola IgG 153.0 Immune >29.9 AU/mL    Comment: (NOTE)                                 Negative        <25.0                                 Equivocal 25.0 - 29.9                                 Positive        >29.9 Presence of antibodies to Rubeola is presumptive evidence of immunity except when acute infection is suspected.    Mumps IgG <9.0 (L) Immune >10.9 AU/mL    Comment: (NOTE)                                Negative         <9.0                                Equivocal  9.0 - 10.9                                 Positive        >10.9 A positive result generally indicates past exposure to Mumps virus or previous vaccination. Performed At: [facility] [place], [place] [id number] Rush Farmer MD [id number]   Varicella zoster antibody, IgG     Status: None   Collection Time: 08/06/17  8:44 PM  Result Value Ref Range   Varicella IgG 1,151 Immune >165 index    Comment: (NOTE)                               Negative          <135                               Equivocal    135 - 165  Positive          >165 A positive result generally indicates exposure to the pathogen or administration of specific immunoglobulins, but it is not indication of active infection or stage of disease. Performed At: [facility] [place], [place] [id number] Rush Farmer MD YI:[id number]   Glucose, capillary     Status: None   Collection Time: 08/07/17 12:36 AM  Result Value Ref Range   Glucose-Capillary 79 65 - 99 mg/dL  Glucose, capillary     Status: None   Collection Time: 08/07/17  4:50 AM  Result Value Ref Range   Glucose-Capillary 88 65 - 99 mg/dL  Urine Drug Screen, Qualitative ([facility] only)     Status: Abnormal   Collection Time: 08/07/17  9:10 AM  Result Value Ref Range   Tricyclic, Ur Screen NONE DETECTED NONE DETECTED   Amphetamines, Ur Screen NONE DETECTED NONE DETECTED   MDMA (Ecstasy)Ur Screen NONE DETECTED NONE DETECTED   Cocaine Metabolite,Ur [place] NONE DETECTED NONE DETECTED   Opiate, Ur Screen NONE DETECTED NONE DETECTED   Phencyclidine (PCP) Ur S NONE DETECTED NONE DETECTED   Cannabinoid 50 Ng, Ur [place] POSITIVE (A) NONE DETECTED   Barbiturates, Ur Screen NONE DETECTED NONE DETECTED   Benzodiazepine, Ur Scrn NONE DETECTED NONE DETECTED   Methadone Scn, Ur NONE DETECTED NONE DETECTED    Comment: (NOTE) 500  Tricyclics, urine               Cutoff 1000 ng/mL 200  Amphetamines, urine             Cutoff 1000 ng/mL 300   MDMA (Ecstasy), urine           Cutoff 500 ng/mL 400  Cocaine Metabolite, urine       Cutoff 300 ng/mL 500  Opiate, urine                   Cutoff 300 ng/mL 600  Phencyclidine (PCP), urine      Cutoff 25 ng/mL 700  Cannabinoid, urine              Cutoff 50 ng/mL 800  Barbiturates, urine             Cutoff 200 ng/mL 900  Benzodiazepine, urine           Cutoff 200 ng/mL 1000 Methadone, urine                Cutoff 300 ng/mL 1100 1200 The urine drug screen provides only a preliminary, unconfirmed 1300 analytical test result and should not be used for non-medical 1400 purposes. Clinical consideration and professional judgment should 1500 be applied to any positive drug screen result due to possible 1600 interfering substances. A more specific alternate chemical method 1700 must be used in order to obtain a confirmed analytical result.  1800 Gas chromato graphy / mass spectrometry (GC/MS) is the preferred 1900 confirmatory method.   Glucose, capillary     Status: Abnormal   Collection Time: 08/07/17  9:13 AM  Result Value Ref Range   Glucose-Capillary 179 (H) 65 - 99 mg/dL  Glucose, capillary     Status: Abnormal   Collection Time: 08/07/17  1:24 PM  Result Value Ref Range   Glucose-Capillary 101 (H) 65 - 99 mg/dL  Glucose, capillary     Status: Abnormal   Collection Time: 08/07/17  6:14 PM  Result Value Ref Range   Glucose-Capillary 128 (H) 65 - 99 mg/dL  CBC  Status: Abnormal   Collection Time: 08/08/17  5:33 AM  Result Value Ref Range   WBC 13.1 (H) 3.6 - 11.0 K/uL   RBC 3.75 (L) 3.80 - 5.20 MIL/uL   Hemoglobin 9.6 (L) 12.0 - 16.0 g/dL   HCT 29.5 (L) 35.0 - 47.0 %   MCV 78.7 (L) 80.0 - 100.0 fL   MCH 25.5 (L) 26.0 - 34.0 pg   MCHC 32.4 32.0 - 36.0 g/dL   RDW 16.1 (H) 11.5 - 14.5 %   Platelets 216 150 - 440 K/uL    Assessment:   31 y.o. G2P2002 postpartum day # 1, in stable condition.  Plan:    1) Acute blood loss anemia - hemodynamically stable and asymptomatic -  PO ferrous sulfate  2) Blood Type --/--/AB POS (11/01 1626)   3) Rubella 1.28 (11/01 2044) / Varicella immune /TDAP status: needs postpartum  4) Breast and formula feeding  5) Contraception - OCP  6) Disposition - continue routine postpartum care and anticipate discharge tomorrow.  Avel Sensor, CNM 08/08/2017  8:55 AM

## 2017-08-09 MED ORDER — FERROUS SULFATE 325 (65 FE) MG PO TABS: 325.0000 mg | ORAL_TABLET | Freq: Two times a day (BID) | ORAL | 1 refills | Status: DC

## 2017-08-09 MED ORDER — INFLUENZA VAC SPLIT QUAD 0.5 ML IM SUSY
0.5000 mL | PREFILLED_SYRINGE | INTRAMUSCULAR | Status: AC | PRN
  Administered 2017-08-09: 0.5 mL via INTRAMUSCULAR
  Filled 2017-08-09: qty 0.5

## 2017-08-09 NOTE — Progress Notes (Signed)
Subjective:  Sitting up in bed, eating breakfast.  Pain control is improved today. Voiding without difficulty. Tolerating a regular diet. Ambulating well.  Objective:   Blood pressure 138/70, pulse 74, temperature 97.8 F (36.6 C), temperature source Oral, resp. rate 18, height 5' 6.5" (1.689 m), weight 284 lb (128.8 kg), last menstrual period 11/17/2016, SpO2 100 %, currently breastfeeding.  General: NAD Pulmonary: no increased work of breathing Abdomen: non-distended, non-tender Uterus:  fundus firm at U/1; lochia rubra small Laceration: well-approximated Extremities: trace edema, no erythema, no tenderness, no signs of DVT  Results for orders placed or performed during the hospital encounter of 08/06/17 (from the past 72 hour(s))  Protein / creatinine ratio, urine     Status: Abnormal   Collection Time: 08/06/17  4:26 PM  Result Value Ref Range   Creatinine, Urine 120 mg/dL   Total Protein, Urine 21 mg/dL    Comment: NO NORMAL RANGE ESTABLISHED FOR THIS TEST   Protein Creatinine Ratio 0.18 (H) 0.00 - 0.15 mg/mg[Cre]  Type and screen [place]     Status: None   Collection Time: 08/06/17  4:26 PM  Result Value Ref Range   ABO/RH(D) AB POS    Antibody Screen NEG    Sample Expiration 08/09/2017   RPR     Status: None   Collection Time: 08/06/17  4:26 PM  Result Value Ref Range   RPR Ser Ql Non Reactive Non Reactive    Comment: (NOTE) Performed At: [facility] [place], [place] [id number] Rush Farmer MD [id number]   CBC     Status: Abnormal   Collection Time: 08/06/17  4:42 PM  Result Value Ref Range   WBC 13.6 (H) 3.6 - 11.0 K/uL   RBC 4.20 3.80 - 5.20 MIL/uL   Hemoglobin 10.9 (L) 12.0 - 16.0 g/dL   HCT 32.9 (L) 35.0 - 47.0 %   MCV 78.2 (L) 80.0 - 100.0 fL   MCH 25.9 (L) 26.0 - 34.0 pg   MCHC 33.1 32.0 - 36.0 g/dL   RDW 16.1 (H) 11.5 - 14.5 %   Platelets 227 150 - 440 K/uL  Comprehensive metabolic panel      Status: Abnormal   Collection Time: 08/06/17  4:42 PM  Result Value Ref Range   Sodium 134 (L) 135 - 145 mmol/L   Potassium 3.9 3.5 - 5.1 mmol/L   Chloride 106 101 - 111 mmol/L   CO2 21 (L) 22 - 32 mmol/L   Glucose, Bld 80 65 - 99 mg/dL   BUN 11 6 - 20 mg/dL   Creatinine, Ser 0.45 0.44 - 1.00 mg/dL   Calcium 9.1 8.9 - 10.3 mg/dL   Total Protein 6.9 6.5 - 8.1 g/dL   Albumin 2.8 (L) 3.5 - 5.0 g/dL   AST 19 15 - 41 U/L   ALT 14 14 - 54 U/L   Alkaline Phosphatase 157 (H) 38 - 126 U/L   Total Bilirubin 0.5 0.3 - 1.2 mg/dL   GFR calc non Af Amer >60 >60 mL/min   GFR calc Af Amer >60 >60 mL/min    Comment: (NOTE) The eGFR has been calculated using the CKD EPI equation. This calculation has not been validated in all clinical situations. eGFR's persistently <60 mL/min signify possible Chronic Kidney Disease.    Anion gap 7 5 - 15  Glucose, capillary     Status: None   Collection Time: 08/06/17  8:42 PM  Result Value Ref Range  Glucose-Capillary 86 65 - 99 mg/dL  Measles/Mumps/Rubella Immunity     Status: Abnormal   Collection Time: 08/06/17  8:44 PM  Result Value Ref Range   Rubella 1.28 Immune >0.99 index    Comment: (NOTE)                                Non-immune       <0.90                                Equivocal  0.90 - 0.99                                Immune           >0.99    Rubeola IgG 153.0 Immune >29.9 AU/mL    Comment: (NOTE)                                 Negative        <25.0                                 Equivocal 25.0 - 29.9                                 Positive        >29.9 Presence of antibodies to Rubeola is presumptive evidence of immunity except when acute infection is suspected.    Mumps IgG <9.0 (L) Immune >10.9 AU/mL    Comment: (NOTE)                                Negative         <9.0                                Equivocal  9.0 - 10.9                                Positive        >10.9 A positive result generally indicates past  exposure to Mumps virus or previous vaccination. Performed At: [facility] [place], [place] [id number] Rush Farmer MD EH:[id number]   Varicella zoster antibody, IgG     Status: None   Collection Time: 08/06/17  8:44 PM  Result Value Ref Range   Varicella IgG 1,151 Immune >165 index    Comment: (NOTE)                               Negative          <135                               Equivocal    135 - 165  Positive          >165 A positive result generally indicates exposure to the pathogen or administration of specific immunoglobulins, but it is not indication of active infection or stage of disease. Performed At: [facility] [place], [place] [id number] Rush Farmer MD GE:[id number]   Glucose, capillary     Status: None   Collection Time: 08/07/17 12:36 AM  Result Value Ref Range   Glucose-Capillary 79 65 - 99 mg/dL  Glucose, capillary     Status: None   Collection Time: 08/07/17  4:50 AM  Result Value Ref Range   Glucose-Capillary 88 65 - 99 mg/dL  Urine Drug Screen, Qualitative ([facility] only)     Status: Abnormal   Collection Time: 08/07/17  9:10 AM  Result Value Ref Range   Tricyclic, Ur Screen NONE DETECTED NONE DETECTED   Amphetamines, Ur Screen NONE DETECTED NONE DETECTED   MDMA (Ecstasy)Ur Screen NONE DETECTED NONE DETECTED   Cocaine Metabolite,Ur [place] NONE DETECTED NONE DETECTED   Opiate, Ur Screen NONE DETECTED NONE DETECTED   Phencyclidine (PCP) Ur S NONE DETECTED NONE DETECTED   Cannabinoid 50 Ng, Ur [place] POSITIVE (A) NONE DETECTED   Barbiturates, Ur Screen NONE DETECTED NONE DETECTED   Benzodiazepine, Ur Scrn NONE DETECTED NONE DETECTED   Methadone Scn, Ur NONE DETECTED NONE DETECTED    Comment: (NOTE) 010  Tricyclics, urine               Cutoff 1000 ng/mL 200  Amphetamines, urine             Cutoff 1000 ng/mL 300  MDMA (Ecstasy), urine           Cutoff 500 ng/mL 400  Cocaine  Metabolite, urine       Cutoff 300 ng/mL 500  Opiate, urine                   Cutoff 300 ng/mL 600  Phencyclidine (PCP), urine      Cutoff 25 ng/mL 700  Cannabinoid, urine              Cutoff 50 ng/mL 800  Barbiturates, urine             Cutoff 200 ng/mL 900  Benzodiazepine, urine           Cutoff 200 ng/mL 1000 Methadone, urine                Cutoff 300 ng/mL 1100 1200 The urine drug screen provides only a preliminary, unconfirmed 1300 analytical test result and should not be used for non-medical 1400 purposes. Clinical consideration and professional judgment should 1500 be applied to any positive drug screen result due to possible 1600 interfering substances. A more specific alternate chemical method 1700 must be used in order to obtain a confirmed analytical result.  1800 Gas chromato graphy / mass spectrometry (GC/MS) is the preferred 1900 confirmatory method.   Glucose, capillary     Status: Abnormal   Collection Time: 08/07/17  9:13 AM  Result Value Ref Range   Glucose-Capillary 179 (H) 65 - 99 mg/dL  Glucose, capillary     Status: Abnormal   Collection Time: 08/07/17  1:24 PM  Result Value Ref Range   Glucose-Capillary 101 (H) 65 - 99 mg/dL  Glucose, capillary     Status: Abnormal   Collection Time: 08/07/17  6:14 PM  Result Value Ref Range   Glucose-Capillary 128 (H) 65 - 99 mg/dL  CBC  Status: Abnormal   Collection Time: 08/08/17  5:33 AM  Result Value Ref Range   WBC 13.1 (H) 3.6 - 11.0 K/uL   RBC 3.75 (L) 3.80 - 5.20 MIL/uL   Hemoglobin 9.6 (L) 12.0 - 16.0 g/dL   HCT 29.5 (L) 35.0 - 47.0 %   MCV 78.7 (L) 80.0 - 100.0 fL   MCH 25.5 (L) 26.0 - 34.0 pg   MCHC 32.4 32.0 - 36.0 g/dL   RDW 16.1 (H) 11.5 - 14.5 %   Platelets 216 150 - 440 K/uL    Assessment:   31 y.o. [id number] postpartum day # 2, in good condition.  Plan:    1) Acute blood loss anemia - hemodynamically stable and asymptomatic - PO ferrous sulfate  2) Blood Type --/--/AB POS (11/01 1626)    3) Rubella 1.28 (11/01 2044) / Varicella immune /  TDAP status: needs before discharge  4) Breast and formula feeding  5) Contraception - OCP  6) Disposition - discharge home today.  Avel Sensor, CNM 08/09/2017  10:39 AM

## 2017-08-09 NOTE — Clinical Social Work Note (Signed)
CSW received consult that MOB had a positive UDS for cannabis and the infant had a negative UDS. CSW will visit the MOB to assess when able.   Argentina PonderKaren Martha Charle Clear, MSW, Theresia MajorsLCSWA [phone]

## 2017-08-09 NOTE — Progress Notes (Signed)
Reviewed D/C instructions with pt and family. Pt verbalized understanding of teaching. Discharged to home via W/C. Pt to schedule f/u appt.  

## 2017-08-09 NOTE — Clinical Social Work Maternal (Signed)
CLINICAL SOCIAL WORK MATERNAL/CHILD NOTE  Patient Details  Name: Carolyn Rosales MRN: [id number] Date of Birth: 07/19/1986  Date:  08/09/2017  Clinical Social Worker Initiating Note:  Santiago Bumpers, MSW, [place] Date/Time: Initiated:  08/09/17/1044     Child's Name:  Carolyn Rosales   Biological Parents:  Mother, Father   Need for Interpreter:  None   Reason for Referral:  Current Substance Use/Substance Use During Pregnancy    Address:  [address] [place]  [address]    Phone number:  [phone] (home)     Additional phone number: [phone]  Household Members/Support Persons (HM/SP):   Household Member/Support Person 1   HM/SP Name Relationship DOB or Age  HM/SP -Redbird Smith FOB/Significant other 42  HM/SP -2        HM/SP -3        HM/SP -4        HM/SP -5        HM/SP -6        HM/SP -7        HM/SP -8          Natural Supports (not living in the home):  [employer], Armed forces technical officer, Extended Family, Friends, Education administrator, [employer], Network engineer Supports: None   Employment: Full-time   Type of Work: Librarian, academic:  [employer] school graduate   Homebound arranged:    Museum/gallery curator Resources:  Multimedia programmer   Other Resources:  Child Support   Cultural/Religious Considerations Which May Impact Care:  None reported  Strengths:  Ability to meet basic needs , Compliance with medical plan , Home prepared for child , Understanding of illness, Pediatrician chosen   Psychotropic Medications:      None  Pediatrician:    [employer]  Pediatrician List:   [place]      Pediatrician Fax Number: [phone]  Risk Factors/Current Problems:  Substance Use (Patient has not used cannabis for over 20 days. Infant has negative UDS.)   Cognitive State:  Able to Concentrate , Alert , Linear Thinking ,  Insightful , Goal Oriented    Mood/Affect:  Calm , Comfortable , Interested , Relaxed    CSW Assessment: CSW met with the patient at bedside to discuss the MOB's positive UDS for cannabinoids. The patient requested that the FOB remain in the room for the discussion. The CSW explained that the infant's UDS is negative for all substances; however, the cord blood results are pending. The CSW stated that if the cord blood results return positive for any substances, a CPS report will be initiated. The patient verbalized understanding and asked appropriate questions about aftercare. The patient stated that she has not smoked cannabis in the past 20 days, and she indicated her reason for use was to assist in nausea control. The CSW provided psychoeducation concerning the possible effects of cannabis on her infant.   The patient has support from family and her 31 YO child. The patient has all needed supplies and reports no resource needs. The patient has chosen a pediatrician for her child, and she is compliant with the medical plan. The CSW sees no current barrier to discharge and will follow up should cord blood results prove positive for any substances.  CSW Plan/Description:  CSW Will Continue to Monitor Umbilical Cord Tissue Drug Screen Results and Make Report if  Warranted    Zettie Pho, LCSW 08/09/2017, 10:58 AM

## 2017-08-11 ENCOUNTER — Ambulatory Visit (INDEPENDENT_AMBULATORY_CARE_PROVIDER_SITE_OTHER): Payer: Medicaid Other | Admitting: Obstetrics and Gynecology

## 2017-08-11 ENCOUNTER — Encounter: Payer: Self-pay | Admitting: Obstetrics and Gynecology

## 2017-08-11 VITALS — BP 124/86 | HR 75 | Ht 67.0 in | Wt 271.0 lb

## 2017-08-11 DIAGNOSIS — R32 Unspecified urinary incontinence: Secondary | ICD-10-CM

## 2017-08-11 DIAGNOSIS — O24419 Gestational diabetes mellitus in pregnancy, unspecified control: Secondary | ICD-10-CM

## 2017-08-11 DIAGNOSIS — O24439 Gestational diabetes mellitus in the puerperium, unspecified control: Secondary | ICD-10-CM

## 2017-08-11 DIAGNOSIS — O0990 Supervision of high risk pregnancy, unspecified, unspecified trimester: Secondary | ICD-10-CM

## 2017-08-11 MED ORDER — SERTRALINE HCL 25 MG PO TABS: 25.0000 mg | ORAL_TABLET | Freq: Every day | ORAL | 11 refills | Status: DC

## 2017-08-11 MED ORDER — IBUPROFEN 800 MG PO TABS: 800.0000 mg | ORAL_TABLET | Freq: Three times a day (TID) | ORAL | 1 refills | Status: DC | PRN

## 2017-08-11 NOTE — Patient Instructions (Addendum)
Perinatal Depression When a woman feels excessive sadness, anger, or anxiety during pregnancy or during the first 12 months after she gives birth, she has a condition called perinatal depression. Depression can interfere with work, school, relationships, and other everyday activities. If it is not managed properly, it can also cause problems in the mother and her baby. Sometimes, perinatal depression is left untreated because symptoms are thought to be normal mood swings during and right after pregnancy. If you have symptoms of depression, it is important to talk with your health care provider. What are the causes? The exact cause of this condition is not known. Hormonal changes during and after pregnancy may play a role in causing perinatal depression. What increases the risk? You are more likely to develop this condition if:  You have a personal or family history of depression, anxiety, or mood disorders.  You experience a stressful life event during pregnancy, such as the death of a loved one.  You have a lot of regular life stress.  You do not have support from family members or loved ones, or you are in an abusive relationship.  What are the signs or symptoms? Symptoms of this condition include:  Feeling sad or hopeless.  Feelings of guilt.  Feeling irritable or overwhelmed.  Changes in your appetite.  Lack of energy or motivation.  Sleep problems.  Difficulty concentrating or completing tasks.  Loss of interest in hobbies or relationships.  Headaches or stomach problems that do not go away.  How is this diagnosed? This condition is diagnosed based on a physical exam and mental evaluation. In some cases, your health care provider may use a depression screening tool. These tools include a list of questions that can help a health care provider diagnose depression. Your health care provider may refer you to a mental health expert who specializes in depression. How is this  treated? This condition may be treated with:  Medicines. Your health care provider will only give you medicines that have been proven safe for pregnancy and breastfeeding.  Talk therapy with a mental health professional to help change your patterns of thinking (cognitive behavioral therapy).  Support groups.  Brain stimulation or light therapies.  Stress reduction therapies, such as mindfulness.  Follow these instructions at home: Lifestyle  Do not use any products that contain nicotine or tobacco, such as cigarettes and e-cigarettes. If you need help quitting, ask your health care provider.  Do not use alcohol when you are pregnant. After your baby is born, limit alcohol intake to no more than 1 drink a day. One drink equals 12 oz of beer, 5 oz of wine, or 1 oz of hard liquor.  Consider joining a support group for new mothers. Ask your health care provider for recommendations.  Take good care of yourself. Make sure you: ? Get plenty of sleep. If you are having trouble sleeping, talk with your health care provider. ? Eat a healthy diet. This includes plenty of fruits and vegetables, whole grains, and lean proteins. ? Exercise regularly, as told by your health care provider. Ask your health care provider what exercises are safe for you. General instructions  Take over-the-counter and prescription medicines only as told by your health care provider.  Talk with your partner or family members about your feelings during pregnancy. Share any concerns or anxieties that you may have.  Ask for help with tasks or chores when you need it. Ask friends and family members to provide meals, watch your children,   or help with cleaning.  Keep all follow-up visits as told by your health care provider. This is important. Contact a health care provider if:  You (or people close to you) notice that you have any symptoms of depression.  You have depression and your symptoms get worse.  You  experience side effects from medicines, such as nausea or sleep problems. Get help right away if:  You feel like hurting yourself, your baby, or someone else. If you ever feel like you may hurt yourself or others, or have thoughts about taking your own life, get help right away.     Breast Pumping Tips If you are breastfeeding, there may be times when you cannot feed your baby directly. Returning to work or going on a trip are examples. Pumping allows you to store breast milk and feed it to your baby later. You may not get much milk when you first start to pump. Your breasts should start to make more after a few days. If you pump at the times you usually feed your baby, you may be able to keep making enough milk to feed your baby without also using formula. The more often you pump, the more milk your body will make. When should I pump?  You can start to pump soon after you have your baby. Ask your doctor what is right for you and your baby.  If you are going back to work, start pumping a few weeks before. This gives you time to learn how to pump and to store a supply of milk.  When you are with your baby, feed your baby when he or she is hungry. Pump after each feeding.  When you are away from your baby for many hours, pump for about 15 minutes every 2-3 hours. Pump both breasts at the same time if you can.  If your baby has a formula feeding, make sure to pump close to the same time.  If you drink any alcohol, wait 2 hours before pumping. How do I get ready to pump? Your let-down reflex is your body's natural reaction that makes your breast milk flow. It is easier to make your breast milk flow when you are relaxed. Try these things to help you relax:  Smell one of your baby's blankets or an item of clothing.  Look at a picture or video of your baby.  Sit in a quiet, private space.  Massage the breast you plan to pump.  Place soothing warmth on the breast.  Play relaxing  music.  What are some breast pumping tips?  Wash your hands before you pump. You do not need to wash your nipples or breasts.  There are three ways to pump. You can: ? Use your hand to massage and squeeze your breast. ? Use a handheld manual pump. ? Use an electric pump.  Make sure the suction cup on the breast pump is the right size. Place the suction cup directly over the nipple. It can be painful or hurt your nipple if it is the wrong size or placed wrong.  Put a small amount of purified or modified lanolin on your nipple and areola if you are sore.  If you are using an electric pump, change the speed and suction power to be more comfortable.  You may need a different type of pump if pumping hurts or you do not get a lot of milk. Your doctor can help you pick what type of pump to use.  Keep  a full water bottle near you always. Drinking lots of fluid helps you make more milk.  You can store your milk to use later. Pumped breast milk can be stored in a sealable, sterile container or plastic bag. Always put the date you pumped it on the container. ? Milk can stay out at room temperature for up to 8 hours. ? You can store your milk in the refrigerator for up to 8 days. ? You can store your milk in the freezer for 3 months. Thaw frozen milk using warm water. Do not put it in the microwave.  Do not smoke. Ask your doctor for help. When should I call my doctor?  You have a hard time pumping.  You are worried you do not make enough milk.  You have nipple pain, soreness, or redness.  You want to take birth control pills. This information is not intended to replace advice given to you by your health care provider. Make sure you discuss any questions you have with your health care provider. Document Released: 03/10/2008 Document Revised: 02/28/2016 Document Reviewed: 07/15/2013 Elsevier Interactive Patient Education  2017 Elsevier Inc.   Breastfeeding and Inducing Lactation Induced  lactation is a process in which a woman who is not producing breast milk is made to produce it. Induced lactation may be done in cases of:  Adoption.  Having another woman give birth to your baby (surrogacy).  Restarting breastfeeding after stopping it for a period of time.  A mother who is nursing an older toddler and wants to meet the milk supply for a newborn.  Induced lactation is more likely to be successful in women who have been pregnant before. How does the body produce breast milk? The process of producing breast milk starts when you get pregnant. At this time, hormones in your body change to prepare your body to make breast milk. Once your baby is born, your hormones send signals that tell your body to make breast milk. How does induced lactation work? Induced lactation reproduces the process that the body naturally goes through to make breast milk. To help make breast milk, you may need to:  Take medicines.  Practice breast stimulation techniques. Breast stimulation techniques mimic a baby suckling at the breast. They can be done by: ? Gently rubbing and stretching your nipples. ? Using a double electric hospital-grade pump to pump your breasts.  If you choose induced lactation, you may need to start taking medicines 3-4 months before you want to start breastfeeding. About 6 weeks before the planned breastfeeding start date, you may need to stop taking the medicines and start doing breast stimulation techniques several times per day. If you use a pump to pump your breasts, you may need to pump both breasts at the same time every 3 hours (8 times a day) for 20 minutes. Once your body is making milk and you start breastfeeding, your body will naturally increase the amount of milk it makes in response to the smell, sound, and feel of your baby. Will I make enough milk to feed my baby? Very few women are able to make all the milk their baby needs. If you choose induced lactation, you  may need to supplement feedings with donated breast milk or infant formula to make sure your baby gets enough nutrition. What else do I need to know?  Take medicines only as directed by your health care provider or trained lactation consultant.  Herbal medicines are available to induce lactation. These medicines  are not approved or regulated by the FDA. Always check with your health care provider before using any herbal medicines.  If you need guidance, talk to your health care provider or lactation consultant. He or she may be able to help you start a milk supply and advise you in making important decisions about feeding your baby.  Induced lactation may cause you to experience some changes in your body, such as: ? Mild to moderate changes in your menstrual cycle. ? Some breast changes that include a feeling of fullness. ? Some changes in your breast shape. ? Milk leaking from your breasts from time to time.  Supplemental nursing systems are available to provide extra donated breast milk or formula at the breast while a baby nurses. The systems ensure that an infant gets enough nutrition during breastfeeding. Ask a lactation specialist for help finding and using this device.  Newborns or babies younger than 571 month old usually root at and accept the breast when using a supplemental nursing system. Rooting is when a baby opens his or her mouth upon being stroked on the cheek or lips. Contact a health care provider if:  Your baby is older than 905 days old and: ? Does not seem satisfied after feeding at the breast. ? Is not producing 5-6 wet diapers per day. ? Is not producing 3 stools per day. Get help right away if:  Your breasts become swollen, red, and tender. Summary  Pregnancy naturally prepares the breasts to make breast milk. Induced lactation is a process in which a woman who is not producing breast milk is made to produce it.  Lactation is usually induced by taking medicines and  practicing breast stimulation techniques.  Very few women are able to make all the milk their baby needs. You may need to supplement feedings with donated breast milk or infant formula to make sure your baby gets enough nutrition. This information is not intended to replace advice given to you by your health care provider. Make sure you discuss any questions you have with your health care provider. Document Released: 09/22/2005 Document Revised: 09/23/2016 Document Reviewed: 09/23/2016 Elsevier Interactive Patient Education  2017 Elsevier Inc. You can go to your nearest emergency department or call:  Your local emergency services (911 in the [place]  A suicide crisis helpline, such as the National Suicide Prevention Lifeline at 434-328-36611-909-085-7587. This is open 24 hours a day.  Summary  Perinatal depression is when a woman feels excessive sadness, anger, or anxiety during pregnancy or during the first 12 months after she gives birth.  If perinatal depression is not treated, it can lead to health problems for the mother and her baby.  This condition is treated with medicines, talk therapy, stress reduction therapies, or a combination of two or more treatments.  Talk with your partner or family members about your feelings. Do not be afraid to ask for help. This information is not intended to replace advice given to you by your health care provider. Make sure you discuss any questions you have with your health care provider. Document Released: 11/19/2016 Document Revised: 11/19/2016 Document Reviewed: 11/19/2016 Elsevier Interactive Patient Education  2018 [employer]   Kegel Exercises Kegel exercises help strengthen the muscles that support the rectum, vagina, small intestine, bladder, and uterus. Doing Kegel exercises can help:  Improve bladder and bowel control.  Improve sexual response.  Reduce problems and discomfort during pregnancy.  Kegel exercises involve squeezing your pelvic  floor muscles, which are the  same muscles you squeeze when you try to stop the flow of urine. The exercises can be done while sitting, standing, or lying down, but it is best to vary your position. Phase 1 exercises 1. Squeeze your pelvic floor muscles tight. You should feel a tight lift in your rectal area. If you are a female, you should also feel a tightness in your vaginal area. Keep your stomach, buttocks, and legs relaxed. 2. Hold the muscles tight for up to 10 seconds. 3. Relax your muscles. Repeat this exercise 50 times a day or as many times as told by your health care provider. Continue to do this exercise for at least 4-6 weeks or for as long as told by your health care provider. This information is not intended to replace advice given to you by your health care provider. Make sure you discuss any questions you have with your health care provider. Document Released: 09/08/2012 Document Revised: 05/17/2016 Document Reviewed: 08/12/2015 Elsevier Interactive Patient Education  [employer]2018 Elsevier Inc.

## 2017-08-11 NOTE — Progress Notes (Signed)
  OBSTETRICS POSTPARTUM CLINIC PROGRESS NOTE  Subjective:     Carolyn Rosales is a 31 y.o. [phone] female who presents for a postpartum visit. She is 4 day postpartum following a Term pregnancy and delivery by Vaginal, no problems at delivery.  I have fully reviewed the prenatal and intrapartum course. Anesthesia: epidural.  Postpartum course has been complicated by uncomplicated.  Baby is feeding by breast and bottle.  Bleeding: patient has not  resumed menses.  Bowel function is normal. Bladder function is abnormal: incontinence. She reported that the foley catheter was not removed before her delivery and she pushed the foley bulb through her urethra while it was inflated.  Patient is not sexually active. Patient is considering contraception with  Postpartum depression screening: positive. Edinburgh 22.  The following portions of the patient's history were reviewed and updated as appropriate: allergies, current medications, past family history, past medical history, past social history, past surgical history and problem list.  Review of Systems Eyes: negative Respiratory: negative Cardiovascular: negative Gastrointestinal: negative Genitourinary:positive for pochia, urinary incontinence Musculoskeletal:positive for back pain  Objective:    There were no vitals taken for this visit.  General:  alert and no distress  Lungs: clear to auscultation bilaterally  Heart:  regular rate and rhythm, S1, S2 normal, no murmur, click, rub or gallop  Abdomen: soft, non-tender; bowel sounds normal; no masses,  no organomegaly.            Assessment:  Post Partum Care visit 31yo G2P2 postpartum day number 4 from a vaginal delivery.  Plan:  Postpartum Depression Will start Zoloft 25mg  qday Gave patient resources Close follow up in 1 week Difficulty breastfeeding  Patient is primarily pumping. She has had minimal milk production. Was given information for lactation consultants  with hospital and pediatrician Urinary Incontinence Traumatic foley catheter at birth. Advised kegel Exercise. Evaluate at 6 week postpartum visit.   Contraceptive counseling  Patient is considering an  IUD. Referred to [email or web address]. Discuss at next visit Gestational Diabetes Will need 2hour GTT at 6week postpartum visit Gestational Hypertension  Normal BP today. No symptoms of preeclampsia.  Follow up in: 1 week or as needed.   Adelene Idlerhristanna Schuman MD [facility], [facility] 08/11/2017  8:15 AM

## 2017-08-12 ENCOUNTER — Telehealth: Payer: Self-pay | Admitting: Obstetrics and Gynecology

## 2017-08-12 NOTE — Telephone Encounter (Signed)
08/07/17 and 09/21/17

## 2017-08-12 NOTE — Telephone Encounter (Signed)
Patient needs letter stating when she delivered and her estimated return to work date.  Please fax to [employer] [phone].

## 2017-08-12 NOTE — Telephone Encounter (Signed)
Please advise 

## 2017-08-13 NOTE — Telephone Encounter (Signed)
Letter done and [place] to [place] to fax to [employer]. KJ CMA

## 2017-08-14 NOTE — Telephone Encounter (Signed)
Pt states fax # is [phone].  Pt's # is [phone]

## 2017-08-14 NOTE — Telephone Encounter (Signed)
Letter faxed to number given

## 2017-08-14 NOTE — Telephone Encounter (Signed)
LVM for patient to call us back to let us know the fax number for [employer]. KJ CMA

## 2017-08-21 ENCOUNTER — Ambulatory Visit (INDEPENDENT_AMBULATORY_CARE_PROVIDER_SITE_OTHER): Payer: 59 | Admitting: Obstetrics and Gynecology

## 2017-08-21 ENCOUNTER — Encounter: Payer: Self-pay | Admitting: Obstetrics and Gynecology

## 2017-08-21 VITALS — BP 132/82 | HR 77 | Ht 67.5 in | Wt 258.0 lb

## 2017-08-21 DIAGNOSIS — Z87898 Personal history of other specified conditions: Secondary | ICD-10-CM

## 2017-08-21 DIAGNOSIS — F53 Postpartum depression: Secondary | ICD-10-CM

## 2017-08-21 DIAGNOSIS — O99345 Other mental disorders complicating the puerperium: Secondary | ICD-10-CM

## 2017-08-21 DIAGNOSIS — O24419 Gestational diabetes mellitus in pregnancy, unspecified control: Secondary | ICD-10-CM

## 2017-08-21 DIAGNOSIS — R32 Unspecified urinary incontinence: Secondary | ICD-10-CM

## 2017-08-21 DIAGNOSIS — Z8742 Personal history of other diseases of the female genital tract: Secondary | ICD-10-CM

## 2017-08-21 HISTORY — DX: Postpartum depression: F53.0

## 2017-08-21 NOTE — Progress Notes (Signed)
Postpartum Visit  Chief Complaint:  Chief Complaint  Patient presents with  . Follow-up    post partum depression    History of Present Illness: Patient is a 31 y.o. [id number] presents for postpartum visit.  Patient here for follow up of postpartum depression. She was placed on Zoloft two weeks ago. She has experienced a big improvement in her symptoms. She is states" I'm not crying anymore.My life's not perfect, but I'm much happier. " She was able to obtain help with lactation from the lactation consultant. She is working on latching again after the tips that she received. She has experienced an increase in her milk production. She is having normal small lochia.  Her urinary incontinence has improved with kegels. She says, " I don't have to wear adult diaper anymore." She is still considering an IUD, has not yet reviewed [email or web address].   Review the Delivery Report for details.  Date of delivery: 08/07/2017 This patient has no babies on file. Type of delivery: Vaginal delivery  Episiotomy No.  Laceration: yes  Pregnancy or labor problems:  yes  Breast Feeding:  yes Lochia: less flow than a normal period Post partum depression/anxiety noted:  yes Edinburgh Post-Partum Depression Score:  8  Date of last PAP: 12/24/2016  NIL, but positive for high risk HPV.   Any problems since the delivery:  yes  Newborn Details:  SINGLETON :  1. BabyGender:female. Birth weight: This patient has no babies on file. Infant Status: Infant doing well at home with mother.   Review of Systems: ROS   Past Medical History:  Past Medical History:  Diagnosis Date  . Anxiety   . Asthma    as a child  . Depression   . Gestational diabetes   . Herpes genitalis   . History of abnormal cervical Pap smear   . Obesity   . PTSD (post-traumatic stress disorder)   . PTSD (post-traumatic stress disorder)     Past Surgical History:  Past Surgical History:  Procedure Laterality Date  . NO PAST  SURGERIES      Family History:  Family History  Problem Relation Age of Onset  . Multiple sclerosis Mother   . Diabetes Mellitus II Mother   . Hypertension Mother   . Breast cancer Maternal Aunt   . Breast cancer Paternal Aunt   . Breast cancer Paternal Grandmother   . Diabetes Father     Social History:  Social History   Socioeconomic History  . Marital status: Married    Spouse name: Not on file  . Number of children: 1  . Years of education: Not on file  . Highest education level: Not on file  Social Needs  . Financial resource strain: Not on file  . Food insecurity - worry: Not on file  . Food insecurity - inability: Not on file  . Transportation needs - medical: Not on file  . Transportation needs - non-medical: Not on file  Occupational History  . Not on file  Tobacco Use  . Smoking status: Former Games developermoker  . Smokeless tobacco: Never Used  Substance and Sexual Activity  . Alcohol use: No  . Drug use: No    Comment: History of daily marijuana use prior to pregnancy  . Sexual activity: Not Currently    Partners: Male    Birth control/protection: None  Other Topics Concern  . Not on file  Social History Narrative  . Not on file    Allergies:  No  Known Allergies  Medications: Prior to Admission medications   Medication Sig Start Date End Date Taking? Authorizing Provider  ferrous sulfate (FERROUSUL) 325 (65 FE) MG tablet Take 1 tablet (325 mg total) 2 (two) times daily with a meal by mouth. Patient not taking: Reported on 08/11/2017 08/09/17   Oswaldo ConroySchmid, Jacelyn Y, CNM  ibuprofen (ADVIL,MOTRIN) 800 MG tablet Take 1 tablet (800 mg total) every 8 (eight) hours as needed by mouth. 08/11/17   Natale MilchSchuman, Marjon Doxtater R, MD  omeprazole (PRILOSEC OTC) 20 MG tablet Take by mouth. 05/01/17   [provider]  sertraline (ZOLOFT) 25 MG tablet Take 1 tablet (25 mg total) daily by mouth. 08/11/17   Natale MilchSchuman, Chi Woodham R, MD    Physical Exam Vitals:  Vitals:   08/21/17  1412  BP: 132/82  Pulse: 77    OBGyn Exam  Assessment: 31 y.o. [id number] presenting for 6 week postpartum visit  Plan: Problem List Items Addressed This Visit    None     Postpartum Depression Patient's mood is improved with Zoloft. Difficulty breastfeeding  Patient is primarily pumping. She has had minimal milk production. Follow up with lactation has helped. Urinary Incontinence Traumatic foley catheter at birth. Patient is having improvement with kegel exercises.   Contraceptive counseling  Patient is considering an  IUD. Referred to [email or web address], patient declined brochure. She will decide at next visit.  Gestational Diabetes Will need 2hour GTT at 6 week postpartum visit Gestational Hypertension  Normal BP today. No symptoms of preeclampsia. History of abnormal Pap Smear NIL, but high risk HPV+, [place] from [place]. Repeat Pap in March 2019.

## 2017-09-10 ENCOUNTER — Encounter: Payer: Self-pay | Admitting: Obstetrics and Gynecology

## 2017-09-10 ENCOUNTER — Ambulatory Visit (INDEPENDENT_AMBULATORY_CARE_PROVIDER_SITE_OTHER): Payer: 59 | Admitting: Obstetrics and Gynecology

## 2017-09-10 VITALS — BP 130/70 | HR 73 | Ht 67.0 in | Wt 270.0 lb

## 2017-09-10 DIAGNOSIS — Z30430 Encounter for insertion of intrauterine contraceptive device: Secondary | ICD-10-CM

## 2017-09-10 DIAGNOSIS — O24419 Gestational diabetes mellitus in pregnancy, unspecified control: Secondary | ICD-10-CM

## 2017-09-10 DIAGNOSIS — F419 Anxiety disorder, unspecified: Secondary | ICD-10-CM

## 2017-09-10 DIAGNOSIS — F53 Postpartum depression: Secondary | ICD-10-CM

## 2017-09-10 DIAGNOSIS — O99345 Other mental disorders complicating the puerperium: Secondary | ICD-10-CM

## 2017-09-10 NOTE — Progress Notes (Signed)
  IUD PROCEDURE NOTE:  Carolyn Rosales is a 31 y.o. [phone] here for IUD insertion. No GYN concerns. She has been having a difficult postpartum course complicated by depression. She is feeling okay today, but reports that she has not been taking her medicine daily because she forget to. She has stopped breast feeding because of issues with supply.   IUD Insertion Procedure Note Patient identified, informed consent performed, consent signed.   Discussed risks of irregular bleeding, cramping, infection, malpositioning or misplacement of the IUD outside the uterus which may require further procedure such as laparoscopy, risk of failure <1%. Time out was performed.  Urine pregnancy test negative.  A bimanual exam showed the uterus to be anteverted.  Speculum placed in the vagina.  Cervix visualized.  Cleaned with Betadine x 2.  Grasped anteriorly with a single tooth tenaculum.  Uterus sounded to 7 cm.   IUD placed per manufacturer's recommendations.  Strings trimmed to 3 cm. Tenaculum was removed, good hemostasis noted.  Patient tolerated procedure well.   Patient was given post-procedure instructions.  She was advised to have backup contraception for one week.  Patient was also asked to check IUD strings periodically and follow up in 4 weeks for IUD check. Patient will also need 6week PP visit in 1 week for gtt testing  Adelene Idlerhristanna Satvik Parco, MD, Merlinda FrederickFACOG [facility], [facility] 09/10/2017  2:00 PM

## 2017-09-24 ENCOUNTER — Other Ambulatory Visit: Payer: Medicaid Other

## 2017-09-24 ENCOUNTER — Ambulatory Visit (INDEPENDENT_AMBULATORY_CARE_PROVIDER_SITE_OTHER): Payer: 59 | Admitting: Obstetrics and Gynecology

## 2017-09-24 ENCOUNTER — Encounter: Payer: Self-pay | Admitting: Obstetrics and Gynecology

## 2017-09-24 VITALS — BP 112/70 | HR 92 | Wt 277.0 lb

## 2017-09-24 DIAGNOSIS — O24419 Gestational diabetes mellitus in pregnancy, unspecified control: Secondary | ICD-10-CM

## 2017-09-24 DIAGNOSIS — Z124 Encounter for screening for malignant neoplasm of cervix: Secondary | ICD-10-CM

## 2017-09-24 DIAGNOSIS — B977 Papillomavirus as the cause of diseases classified elsewhere: Secondary | ICD-10-CM

## 2017-09-24 HISTORY — DX: Papillomavirus as the cause of diseases classified elsewhere: B97.7

## 2017-09-24 MED ORDER — SERTRALINE HCL 50 MG PO TABS: 50.0000 mg | ORAL_TABLET | Freq: Every day | ORAL | 2 refills | Status: DC

## 2017-09-24 NOTE — Progress Notes (Signed)
Postpartum Visit  Chief Complaint:  Chief Complaint  Patient presents with  . Postpartum Care    Delivered 08/07/17/  2 hour GTT today    History of Present Illness: Patient is a 31 y.o. [id number] presents for postpartum visit.   Review the Delivery Report for details.  Date of delivery: 08/07/2017 Type of delivery: Vaginal delivery - Vacuum or forceps assisted  no Episiotomy No.  Laceration: yes 1st degree Pregnancy or labor problems:  Yes GDM, GHTN Any problems since the delivery:  no  Newborn Details:  SINGLETON :  1. BabyGender: Female Birth weight: 8lbs 1.5oz Maternal Details:  Breast Feeding:  yes Post partum depression/anxiety noted:  Yes, started on Zoloft 08/11/2017 Edinburgh Post-Partum Depression Score:  14 item 10 is 0  Date of last PAP: 04/012015  normal  Review of Systems: Review of Systems  Constitutional: Negative for chills and fever.  Gastrointestinal: Negative for nausea.  Genitourinary: Negative for dysuria, frequency and urgency.  Neurological: Negative for dizziness and headaches.    The following portions of the patient's history were reviewed and updated as appropriate: allergies, current medications, past family history, past medical history, past social history, past surgical history and problem list.  Past Medical History:  Past Medical History:  Diagnosis Date  . Anxiety   . Asthma    as a child  . Depression   . Gestational diabetes   . Herpes genitalis   . History of abnormal cervical Pap smear   . Obesity   . PTSD (post-traumatic stress disorder)   . PTSD (post-traumatic stress disorder)     Past Surgical History:  Past Surgical History:  Procedure Laterality Date  . NO PAST SURGERIES      Family History:  Family History  Problem Relation Age of Onset  . Multiple sclerosis Mother   . Diabetes Mellitus II Mother   . Hypertension Mother   . Breast cancer Maternal Aunt   . Breast cancer Paternal Aunt   . Breast cancer  Paternal Grandmother   . Diabetes Father     Social History:  Social History   Socioeconomic History  . Marital status: Married    Spouse name: Not on file  . Number of children: 1  . Years of education: Not on file  . Highest education level: Not on file  Social Needs  . Financial resource strain: Not on file  . Food insecurity - worry: Not on file  . Food insecurity - inability: Not on file  . Transportation needs - medical: Not on file  . Transportation needs - non-medical: Not on file  Occupational History  . Not on file  Tobacco Use  . Smoking status: Former Games developermoker  . Smokeless tobacco: Never Used  Substance and Sexual Activity  . Alcohol use: No  . Drug use: No    Comment: History of daily marijuana use prior to pregnancy  . Sexual activity: Yes    Partners: Male    Birth control/protection: IUD  Other Topics Concern  . Not on file  Social History Narrative  . Not on file    Allergies:  No Known Allergies  Medications: Prior to Admission medications   Medication Sig Start Date End Date Taking? Authorizing Provider  ferrous sulfate (FERROUSUL) 325 (65 FE) MG tablet Take 1 tablet (325 mg total) 2 (two) times daily with a meal by mouth. 08/09/17   Oswaldo ConroySchmid, Jacelyn Y, CNM  ibuprofen (ADVIL,MOTRIN) 800 MG tablet Take 1 tablet (800 mg  total) every 8 (eight) hours as needed by mouth. 08/11/17   Natale MilchSchuman, Christanna R, MD  omeprazole (PRILOSEC OTC) 20 MG tablet Take by mouth. 05/01/17   [provider]  sertraline (ZOLOFT) 25 MG tablet Take 1 tablet (25 mg total) daily by mouth. 08/11/17   Natale MilchSchuman, Christanna R, MD    Physical Exam Vitals:  Vitals:   09/24/17 0824  BP: 112/70  Pulse: 92    General: NAD HEENT: normocephalic, anicteric Pulmonary: No increased work of breathing Abdomen: NABS, soft, non-tender, non-distended.  Umbilicus without lesions.  No hepatomegaly, splenomegaly or masses palpable. No evidence of hernia. Genitourinary:  External: Normal  external female genitalia.  Normal urethral meatus, normal  Bartholin's and Skene's glands.    Vagina: Normal vaginal mucosa, no evidence of prolapse.    Cervix: Grossly normal in appearance, no bleeding  Uterus: Non-enlarged, mobile, normal contour.  No CMT, IUD strings visualized  Adnexa: ovaries non-enlarged, no adnexal masses  Rectal: deferred Extremities: no edema, erythema, or tenderness Neurologic: Grossly intact Psychiatric: mood appropriate, affect full  Assessment: 31 y.o. [id number] presenting for 6 week postpartum visit  Plan: Problem List Items Addressed This Visit      Endocrine   Gestational diabetes - Primary   Relevant Orders   Glucose tolerance, 2 hours    Other Visit Diagnoses    Cervical cancer screening       Relevant Orders   PapIG, HPV, rfx 16/18   Encounter for postpartum visit           1) Doing will with IUD no concerns  2)  Pap - ASCCP guidelines and rational discussed.  Patient opts for 3 year screening interval  3) Patient underwent screening for postpartum depression increase Zoloft to 50mg  - Follow up 6 week to assess response  4) Follow up 1 year for routine annual exam

## 2017-09-25 LAB — GLUCOSE TOLERANCE, 2 HOURS
GLUCOSE FASTING GTT: 81 mg/dL (ref 65–99)
GLUCOSE, 2 HOUR: 96 mg/dL (ref 65–139)

## 2017-10-02 LAB — PAPIG, HPV, RFX 16/18
HPV Genotype, 16: NEGATIVE
HPV Genotype, 18: NEGATIVE
HPV, high-risk: POSITIVE — AB
PAP SMEAR COMMENT: 0

## 2017-11-05 ENCOUNTER — Ambulatory Visit: Payer: Medicaid Other | Admitting: Obstetrics and Gynecology

## 2017-11-11 ENCOUNTER — Ambulatory Visit (INDEPENDENT_AMBULATORY_CARE_PROVIDER_SITE_OTHER): Payer: Medicaid Other | Admitting: Obstetrics and Gynecology

## 2017-11-11 ENCOUNTER — Encounter: Payer: Self-pay | Admitting: Obstetrics and Gynecology

## 2017-11-11 VITALS — BP 132/82 | HR 97 | Ht 67.0 in | Wt 270.0 lb

## 2017-11-11 DIAGNOSIS — F53 Postpartum depression: Secondary | ICD-10-CM

## 2017-11-11 DIAGNOSIS — O99345 Other mental disorders complicating the puerperium: Principal | ICD-10-CM

## 2017-11-11 MED ORDER — HYDROXYZINE HCL 25 MG PO TABS: 25.0000 mg | ORAL_TABLET | Freq: Four times a day (QID) | ORAL | 2 refills | Status: DC | PRN

## 2017-11-11 MED ORDER — SERTRALINE HCL 100 MG PO TABS: 100.0000 mg | ORAL_TABLET | Freq: Every day | ORAL | 3 refills | Status: DC

## 2017-11-11 NOTE — Progress Notes (Signed)
Obstetrics & Gynecology Office Visit   Chief Complaint:  Chief Complaint  Patient presents with  . Follow-up    Medication follow up    History of Present Illness: The patient is a 32 y.o. female presenting follow up for symptoms of anxiety and depression.  The patient is currently taking Zoloft 50mg . for the management of her symptoms.  She has not had any recent situational stressors.  She reports symptoms of insomnia, irritability and social anxiety.  She denies anhedonia, day time somnolence, risk taking behavior, increased appetite, decreased appetite, agorophobia, feelings of guilt, feelings of worthlessness, suicidal ideation, homicidal ideation, auditory hallucinations and visual hallucinations. Symptoms have improved since last visit.     Review of Systems: Review of Systems  Gastrointestinal: Negative for nausea and vomiting.  Neurological: Negative for dizziness and headaches.  Psychiatric/Behavioral: Negative for depression, hallucinations, memory loss, substance abuse and suicidal ideas. The patient is nervous/anxious and has insomnia.      Past Medical History:  Past Medical History:  Diagnosis Date  . Anxiety   . Asthma    as a child  . Depression   . Gestational diabetes   . Herpes genitalis   . History of abnormal cervical Pap smear   . Obesity   . PTSD (post-traumatic stress disorder)   . PTSD (post-traumatic stress disorder)     Past Surgical History:  Past Surgical History:  Procedure Laterality Date  . NO PAST SURGERIES      Gynecologic History: Patient's last menstrual period was 11/06/2017.  Obstetric History: [id number]  Family History:  Family History  Problem Relation Age of Onset  . Multiple sclerosis Mother   . Diabetes Mellitus II Mother   . Hypertension Mother   . Breast cancer Maternal Aunt   . Breast cancer Paternal Aunt   . Breast cancer Paternal Grandmother   . Diabetes Father     Social History:  Social History    Socioeconomic History  . Marital status: Married    Spouse name: Not on file  . Number of children: 1  . Years of education: Not on file  . Highest education level: Not on file  Social Needs  . Financial resource strain: Not on file  . Food insecurity - worry: Not on file  . Food insecurity - inability: Not on file  . Transportation needs - medical: Not on file  . Transportation needs - non-medical: Not on file  Occupational History  . Not on file  Tobacco Use  . Smoking status: Former Games developer  . Smokeless tobacco: Never Used  Substance and Sexual Activity  . Alcohol use: No  . Drug use: No    Comment: History of daily marijuana use prior to pregnancy  . Sexual activity: Yes    Partners: Male    Birth control/protection: IUD  Other Topics Concern  . Not on file  Social History Narrative  . Not on file    Allergies:  No Known Allergies  Medications: Prior to Admission medications   Medication Sig Start Date End Date Taking? Authorizing Provider  ibuprofen (ADVIL,MOTRIN) 800 MG tablet Take 1 tablet (800 mg total) every 8 (eight) hours as needed by mouth. 08/11/17  Yes Schuman, Christanna R, MD  sertraline (ZOLOFT) 50 MG tablet Take 1 tablet (50 mg total) by mouth daily. 09/24/17  Yes Vena Austria, MD    Physical Exam Vitals:  Vitals:   11/11/17 1406  BP: 132/82  Pulse: 97   Patient's last menstrual  period was 11/06/2017.  General: NAD HEENT: normocephalic, anicteric Pulmonary: No increased work of breathing Neurologic: Grossly intact Psychiatric: mood appropriate, affect full, good insight  Female chaperone present for pelvic  portions of the physical exam  Assessment: 32 y.o. [id number] follow up for postpartum anxiety and depression  Plan: Problem List Items Addressed This Visit      Other   Postpartum depression - Primary   Relevant Medications   sertraline (ZOLOFT) 100 MG tablet   hydrOXYzine (ATARAX/VISTARIL) 25 MG tablet     - Increase  zoloft to 100mg  add vistaril prn for anxiety - GAD-7 is 14 PHQ-9 is 2, no SI/HI able to contract for safety - No issues with IUD since placement - Repeat pap is due in March - A total of 15 minutes were spent in face-to-face contact with the patient during this encounter with over half of that time devoted to counseling and coordination of care. - Return in about 6 weeks (around 12/23/2017) for medication follow up and pap.

## 2017-12-23 ENCOUNTER — Ambulatory Visit: Payer: Medicaid Other | Admitting: Obstetrics and Gynecology

## 2017-12-31 ENCOUNTER — Ambulatory Visit: Payer: Medicaid Other | Admitting: Obstetrics and Gynecology

## 2018-10-21 ENCOUNTER — Telehealth: Payer: Self-pay

## 2018-10-21 NOTE — Telephone Encounter (Signed)
Pt has appt 10/29/2018 and needs refill.  [phone]  Left detailed msg to call nurse line with the name of the medication she needs.

## 2018-10-27 NOTE — Telephone Encounter (Signed)
Pt hasn't called with medication name.  Msg closed.

## 2018-10-29 ENCOUNTER — Ambulatory Visit: Payer: Medicaid Other | Admitting: Obstetrics and Gynecology

## 2018-11-05 ENCOUNTER — Other Ambulatory Visit: Payer: Self-pay | Admitting: Obstetrics and Gynecology

## 2018-11-05 NOTE — Telephone Encounter (Signed)
Patient can have 3 months of refills, but needs to schedule an annual appointment

## 2018-11-08 ENCOUNTER — Other Ambulatory Visit: Payer: Self-pay

## 2018-11-08 DIAGNOSIS — F53 Postpartum depression: Secondary | ICD-10-CM

## 2018-11-08 DIAGNOSIS — O99345 Other mental disorders complicating the puerperium: Principal | ICD-10-CM

## 2018-11-08 MED ORDER — SERTRALINE HCL 100 MG PO TABS: 100.0000 mg | ORAL_TABLET | Freq: Every day | ORAL | 3 refills | Status: DC

## 2018-12-28 ENCOUNTER — Ambulatory Visit
Admission: EM | Admit: 2018-12-28 | Discharge: 2018-12-28 | Disposition: A | Discharge status: Home / Self Care | Payer: Medicaid Other | Attending: Family Medicine | Admitting: Family Medicine

## 2018-12-28 ENCOUNTER — Encounter: Payer: Self-pay | Admitting: Emergency Medicine

## 2018-12-28 ENCOUNTER — Other Ambulatory Visit: Payer: Self-pay

## 2018-12-28 DIAGNOSIS — B9789 Other viral agents as the cause of diseases classified elsewhere: Secondary | ICD-10-CM | POA: Insufficient documentation

## 2018-12-28 DIAGNOSIS — J069 Acute upper respiratory infection, unspecified: Secondary | ICD-10-CM

## 2018-12-28 LAB — RAPID STREP SCREEN (MED CTR MEBANE ONLY): Streptococcus, Group A Screen (Direct): NEGATIVE

## 2018-12-28 NOTE — ED Triage Notes (Signed)
Patient in today c/o cough, sob and sore throat x 1 day and getting worse. Patient denies fever.

## 2019-01-01 LAB — CULTURE, GROUP A STREP (THRC)

## 2019-02-05 NOTE — ED Provider Notes (Signed)
[facility]    CSN: [id number] Arrival date & time: 12/28/18  0808     History   Chief Complaint Chief Complaint  Patient presents with  . Cough  . Shortness of Breath  . Sore Throat    HPI Carolyn Rosales is a 33 y.o. female.   The history is provided by the patient.  Cough  Associated symptoms: shortness of breath and sore throat   Associated symptoms: no wheezing   Shortness of Breath  Associated symptoms: cough and sore throat   Associated symptoms: no wheezing   Sore Throat  Associated symptoms include shortness of breath.  URI  Presenting symptoms: cough and sore throat   Presenting symptoms comment:  Shortness of breath Severity:  Moderate Onset quality:  Sudden Duration:  1 day Timing:  Constant Progression:  Unchanged Chronicity:  New Relieved by:  None tried Ineffective treatments:  None tried Associated symptoms: no wheezing   Risk factors: no recent travel and no sick contacts     Past Medical History:  Diagnosis Date  . Anxiety   . Asthma    as a child  . Depression   . Gestational diabetes   . Herpes genitalis   . History of abnormal cervical Pap smear   . Obesity   . PTSD (post-traumatic stress disorder)   . PTSD (post-traumatic stress disorder)     Patient Active Problem List   Diagnosis Date Noted  . Postpartum depression 08/21/2017  . Postpartum care following vaginal delivery 08/21/2017  . Urinary incontinence in female 08/11/2017  . Breast feeding status of mother 08/11/2017  . Indication for care in labor and delivery, antepartum 08/06/2017  . Labor and delivery, indication for care 08/06/2017  . Gestational diabetes 07/15/2017  . BMI 40.0-44.9, adult (HCC) 07/01/2017  . Substance abuse affecting pregnancy, antepartum 05/13/2017  . Herpes genitalis 04/13/2017  . History of abnormal cervical Pap smear 04/13/2017  . Depression 04/09/2017  . Anxiety 04/09/2017  . Maternal obesity, antepartum 04/09/2017   . Supervision of high risk pregnancy, antepartum 04/09/2017  . Marijuana use 04/09/2017    Past Surgical History:  Procedure Laterality Date  . NO PAST SURGERIES      OB History    Gravida  2   Para  2   Term  2   Preterm      AB      Living  2     SAB      TAB      Ectopic      Multiple  0   Live Births  2            Home Medications    Prior to Admission medications   Medication Sig Start Date End Date Taking? Authorizing Provider  hydrOXYzine (ATARAX/VISTARIL) 25 MG tablet Take 1 tablet (25 mg total) by mouth every 6 (six) hours as needed for anxiety. 11/11/17  Yes Vena Austria, MD  levonorgestrel (MIRENA) 20 MCG/24HR IUD 1 each by Intrauterine route once.   Yes [provider]  omeprazole (PRILOSEC) 20 MG capsule Take 20 mg by mouth daily.   Yes [provider]  sertraline (ZOLOFT) 100 MG tablet Take 1 tablet (100 mg total) by mouth daily. 11/08/18  Yes Schuman, Christanna R, MD  sertraline (ZOLOFT) 25 MG tablet TAKE 1 TABLET BY MOUTH DAILY 11/08/18   Schuman, Jaquelyn Bitter, MD    Family History Family History  Problem Relation Age of Onset  . Multiple sclerosis Mother   .  Diabetes Mellitus II Mother   . Hypertension Mother   . Breast cancer Maternal Aunt   . Breast cancer Paternal Aunt   . Breast cancer Paternal Grandmother   . Diabetes Father     Social History Social History   Tobacco Use  . Smoking status: Former Smoker    Last attempt to quit: 12/28/2014    Years since quitting: 4.1  . Smokeless tobacco: Never Used  Substance Use Topics  . Alcohol use: Yes    Comment: rarely  . Drug use: No    Comment: History of daily marijuana use prior to pregnancy     Allergies   Patient has no known allergies.   Review of Systems Review of Systems  HENT: Positive for sore throat.   Respiratory: Positive for cough and shortness of breath. Negative for wheezing.      Physical Exam Triage Vital Signs ED Triage Vitals   Enc Vitals Group     BP 12/28/18 0822 125/89     Pulse Rate 12/28/18 0822 76     Resp 12/28/18 0822 18     Temp 12/28/18 0822 98.2 F (36.8 C)     Temp Source 12/28/18 0822 Oral     SpO2 12/28/18 0822 99 %     Weight 12/28/18 0823 277 lb 4.8 oz (125.8 kg)     Height 12/28/18 0823  (1.702 m)     Head Circumference --      Peak Flow --      Pain Score 12/28/18 0823 0     Pain Loc --      Pain Edu? --      Excl. in GC? --    No data found.  Updated Vital Signs BP 125/89 (BP Location: Left Arm)   Pulse 76   Temp 98.2 F (36.8 C) (Oral)   Resp 18   Ht  (1.702 m)   Wt 125.8 kg   SpO2 99%   BMI 43.43 kg/m   Visual Acuity Right Eye Distance:   Left Eye Distance:   Bilateral Distance:    Right Eye Near:   Left Eye Near:    Bilateral Near:     Physical Exam Vitals signs and nursing note reviewed.  Constitutional:      General: She is not in acute distress.    Appearance: She is not toxic-appearing or diaphoretic.  Cardiovascular:     Rate and Rhythm: Normal rate.  Pulmonary:     Effort: Pulmonary effort is normal. No respiratory distress.     Breath sounds: No stridor.  Neurological:     Mental Status: She is alert.      UC Treatments / Results  Labs (all labs ordered are listed, but only abnormal results are displayed) Labs Reviewed  RAPID STREP SCREEN ([facility] ONLY)  CULTURE, GROUP A STREP [facility])    EKG None  Radiology No results found.  Procedures Procedures (including critical care time)  Medications Ordered in UC Medications - No data to display  Initial Impression / Assessment and Plan / UC Course  I have reviewed the triage vital signs and the nursing notes.  Pertinent labs & imaging results that were available during my care of the patient were reviewed by me and considered in my medical decision making (see chart for details).      Final Clinical Impressions(s) / UC Diagnoses   Final diagnoses:  Viral URI with  cough    ED Prescriptions  None     1. Lab results and diagnosis reviewed with patient 2. Recommend supportive treatment with rest, fluids, otc meds prn 3. Follow-up prn if symptoms worsen or don't improve   Controlled Substance Prescriptions [place] Controlled Substance Registry consulted? Not Applicable   Payton Mccallumonty, Sidnee Gambrill, MD 02/05/19 1450

## 2019-02-25 ENCOUNTER — Other Ambulatory Visit: Payer: Self-pay | Admitting: Obstetrics and Gynecology

## 2019-02-25 NOTE — Telephone Encounter (Signed)
advise

## 2019-03-04 ENCOUNTER — Other Ambulatory Visit: Payer: Self-pay

## 2019-03-04 ENCOUNTER — Ambulatory Visit
Admission: EM | Admit: 2019-03-04 | Discharge: 2019-03-04 | Disposition: A | Discharge status: Home / Self Care | Payer: Medicaid Other | Attending: Family Medicine | Admitting: Family Medicine

## 2019-03-04 ENCOUNTER — Encounter: Payer: Self-pay | Admitting: Emergency Medicine

## 2019-03-04 DIAGNOSIS — K644 Residual hemorrhoidal skin tags: Secondary | ICD-10-CM

## 2019-03-04 MED ORDER — HYDROCODONE-ACETAMINOPHEN 5-325 MG PO TABS: ORAL_TABLET | ORAL | 0 refills | Status: DC

## 2019-03-04 MED ORDER — TRIAMCINOLONE ACETONIDE 0.1 % EX OINT: 1.0000 "application " | TOPICAL_OINTMENT | Freq: Two times a day (BID) | CUTANEOUS | 0 refills | Status: DC

## 2019-03-04 MED ORDER — PREDNISONE 20 MG PO TABS: ORAL_TABLET | ORAL | 0 refills | Status: DC

## 2019-03-04 NOTE — Discharge Instructions (Signed)
Sitz baths

## 2019-03-04 NOTE — ED Triage Notes (Signed)
Pt c/o rectal bleeding and pain. Started about 3 days ago. She had this for years and told she needed surgery but was pregnant and could not have it. She ha known hemorrhoids. She also has genital herpes.

## 2019-03-04 NOTE — ED Provider Notes (Signed)
[facility]    CSN: [id number] Arrival date & time: 03/04/19  1358     History   Chief Complaint Chief Complaint  Patient presents with  . Rectal Bleeding    HPI Carolyn Rosales is a 33 y.o. female.   33 yo female with a c/o rectal/anal pain and bleeding for the past 3 days. States she has a h/o severe hemorrhoids for years. Has been putting preparation H without relief.    Rectal Bleeding    Past Medical History:  Diagnosis Date  . Anxiety   . Asthma    as a child  . Depression   . Gestational diabetes   . Herpes genitalis   . History of abnormal cervical Pap smear   . Obesity   . PTSD (post-traumatic stress disorder)   . PTSD (post-traumatic stress disorder)     Patient Active Problem List   Diagnosis Date Noted  . Postpartum depression 08/21/2017  . Postpartum care following vaginal delivery 08/21/2017  . Urinary incontinence in female 08/11/2017  . Breast feeding status of mother 08/11/2017  . Indication for care in labor and delivery, antepartum 08/06/2017  . Labor and delivery, indication for care 08/06/2017  . Gestational diabetes 07/15/2017  . BMI 40.0-44.9, adult (HCC) 07/01/2017  . Substance abuse affecting pregnancy, antepartum 05/13/2017  . Herpes genitalis 04/13/2017  . History of abnormal cervical Pap smear 04/13/2017  . Depression 04/09/2017  . Anxiety 04/09/2017  . Maternal obesity, antepartum 04/09/2017  . Supervision of high risk pregnancy, antepartum 04/09/2017  . Marijuana use 04/09/2017    Past Surgical History:  Procedure Laterality Date  . NO PAST SURGERIES      OB History    Gravida  2   Para  2   Term  2   Preterm      AB      Living  2     SAB      TAB      Ectopic      Multiple  0   Live Births  2            Home Medications    Prior to Admission medications   Medication Sig Start Date End Date Taking? Authorizing Provider  hydrOXYzine (ATARAX/VISTARIL) 25 MG tablet TAKE  1 TABLET BY MOUTH EVERY 6 HOURS AS NEEDED FOR ANXIETY 02/25/19  Yes Vena Austria, MD  levonorgestrel (MIRENA) 20 MCG/24HR IUD 1 each by Intrauterine route once.   Yes [provider]  omeprazole (PRILOSEC) 20 MG capsule Take 20 mg by mouth daily.   Yes [provider]  sertraline (ZOLOFT) 100 MG tablet Take 1 tablet (100 mg total) by mouth daily. 11/08/18  Yes Schuman, Jaquelyn Bitter, MD  HYDROcodone-acetaminophen (NORCO/VICODIN) 5-325 MG tablet 1-2 tabs po qd prn 03/04/19   Payton Mccallum, MD  predniSONE (DELTASONE) 20 MG tablet 3 tabs po once day 1, then 2 tabs po qd x 2 days, then 1 tab po qd x 2 days, then half a tab po qd x 2 days 03/04/19   Payton Mccallum, MD  sertraline (ZOLOFT) 25 MG tablet TAKE 1 TABLET BY MOUTH DAILY 11/08/18   Schuman, Christanna R, MD  triamcinolone ointment (KENALOG) 0.1 % Apply 1 application topically 2 (two) times daily. 03/04/19   Payton Mccallum, MD    Family History Family History  Problem Relation Age of Onset  . Multiple sclerosis Mother   . Diabetes Mellitus II Mother   . Hypertension Mother   .  Breast cancer Maternal Aunt   . Breast cancer Paternal Aunt   . Breast cancer Paternal Grandmother   . Diabetes Father     Social History Social History   Tobacco Use  . Smoking status: Former Smoker    Last attempt to quit: 12/28/2014    Years since quitting: 4.1  . Smokeless tobacco: Never Used  Substance Use Topics  . Alcohol use: Yes    Comment: rarely  . Drug use: Yes    Types: Marijuana    Comment: History of daily marijuana use prior to pregnancy     Allergies   Patient has no known allergies.   Review of Systems Review of Systems  Gastrointestinal: Positive for hematochezia.     Physical Exam Triage Vital Signs ED Triage Vitals  Enc Vitals Group     BP 03/04/19 1436 (!) 139/100     Pulse Rate 03/04/19 1436 99     Resp 03/04/19 1436 18     Temp 03/04/19 1436 98.2 F (36.8 C)     Temp Source 03/04/19 1436 Oral      SpO2 03/04/19 1436 99 %     Weight 03/04/19 1432 285 lb (129.3 kg)     Height 03/04/19 1432 5\' 7"  (1.702 m)     Head Circumference --      Peak Flow --      Pain Score 03/04/19 1432 5     Pain Loc --      Pain Edu? --      Excl. in GC? --    No data found.  Updated Vital Signs BP (!) 139/100 (BP Location: Left Arm)   Pulse 99   Temp 98.2 F (36.8 C) (Oral)   Resp 18   Ht 5\' 7"  (1.702 m)   Wt 129.3 kg   SpO2 99%   BMI 44.64 kg/m   Visual Acuity Right Eye Distance:   Left Eye Distance:   Bilateral Distance:    Right Eye Near:   Left Eye Near:    Bilateral Near:     Physical Exam Vitals signs and nursing note reviewed. Exam conducted with a chaperone present [place](Brittany, CMA; present).  Constitutional:      General: She is not in acute distress.    Appearance: She is not toxic-appearing or diaphoretic.  Genitourinary:    Rectum: External hemorrhoid present.     Comments: Multiple large, tender external hemorrhoids Neurological:     Mental Status: She is alert.      UC Treatments / Results  Labs (all labs ordered are listed, but only abnormal results are displayed) Labs Reviewed - No data to display  EKG None  Radiology No results found.  Procedures Procedures (including critical care time)  Medications Ordered in UC Medications - No data to display  Initial Impression / Assessment and Plan / UC Course  I have reviewed the triage vital signs and the nursing notes.  Pertinent labs & imaging results that were available during my care of the patient were reviewed by me and considered in my medical decision making (see chart for details).      Final Clinical Impressions(s) / UC Diagnoses   Final diagnoses:  External hemorrhoids     Discharge Instructions     Sitz baths     ED Prescriptions    Medication Sig Dispense Auth. Provider   predniSONE (DELTASONE) 20 MG tablet 3 tabs po once day 1, then 2 tabs po qd x 2 days, then 1  tab po qd x 2  days, then half a tab po qd x 2 days 10 tablet Payton Mccallum, MD   triamcinolone ointment (KENALOG) 0.1 % Apply 1 application topically 2 (two) times daily. 30 g Payton Mccallum, MD   HYDROcodone-acetaminophen (NORCO/VICODIN) 5-325 MG tablet 1-2 tabs po qd prn 6 tablet Payton Mccallum, MD      1. diagnosis reviewed with patient 2. rx as per orders above; reviewed possible side effects, interactions, risks and benefits  3. Recommend supportive treatment as above 4. Follow up with general surgeon  5. Follow-up prn  Controlled Substance Prescriptions [place] Controlled Substance Registry consulted? Not Applicable   Payton Mccallum, MD 03/04/19 [phone]

## 2019-03-09 ENCOUNTER — Ambulatory Visit: Payer: Medicaid Other | Admitting: General Surgery

## 2019-03-10 ENCOUNTER — Other Ambulatory Visit: Payer: Self-pay

## 2019-03-10 ENCOUNTER — Encounter: Payer: Self-pay | Admitting: Surgery

## 2019-03-10 ENCOUNTER — Ambulatory Visit (INDEPENDENT_AMBULATORY_CARE_PROVIDER_SITE_OTHER): Payer: Self-pay | Admitting: Surgery

## 2019-03-10 VITALS — BP 139/81 | HR 88 | Temp 98.1°F | Ht 67.0 in | Wt 276.0 lb

## 2019-03-10 DIAGNOSIS — K642 Third degree hemorrhoids: Secondary | ICD-10-CM

## 2019-03-10 NOTE — Patient Instructions (Addendum)
Patient's surgery to be scheduled for 03/30/19 at [facility] with Dr.   The patient is aware to have COVID-19 testing done on 03/25/19 at the [facility] drive thru ([facility]) between 10:30 am and 12:30 pm. she is aware to isolate after, have no visitors, wash hands frequently, and avoid touching face.   The patient is aware she will be contacted by the Pre-Admission Testing Department to complete a phone interview sometime in the near future.  The patient is aware she will be contacted by the [facility] staff for further instructions including arrival time.   Patient aware to be NPO after midnight and have a driver.   Patient aware that she may have no visitors and driver will need to wait in the car due to COVID-19 restrictions.   The patient verbalizes understanding of the above.   The patient is aware to call the office should she have further questions.       Hemorrhoids Hemorrhoids are swollen veins that may develop:  In the butt (rectum). These are called internal hemorrhoids.  Around the opening of the butt (anus). These are called external hemorrhoids. Hemorrhoids can cause pain, itching, or bleeding. Most of the time, they do not cause serious problems. They usually get better with diet changes, lifestyle changes, and other home treatments. What are the causes? This condition may be caused by:  Having trouble pooping (constipation).  Pushing hard (straining) to poop.  Watery poop (diarrhea).  Pregnancy.  Being very overweight (obese).  Sitting for long periods of time.  Heavy lifting or other activity that causes you to strain.  Anal sex.  Riding a bike for a long period of time. What are the signs or symptoms? Symptoms of this condition include:  Pain.  Itching or soreness in the butt.  Bleeding from the butt.  Leaking poop.  Swelling in the area.  One or more lumps around the opening of your  butt. How is this diagnosed? A doctor can often diagnose this condition by looking at the affected area. The doctor may also:  Do an exam that involves feeling the area with a gloved hand (digital rectal exam).  Examine the area inside your butt using a small tube (anoscope).  Order blood tests. This may be done if you have lost a lot of blood.  Have you get a test that involves looking inside the colon using a flexible tube with a camera on the end (sigmoidoscopy or colonoscopy). How is this treated? This condition can usually be treated at home. Your doctor may tell you to change what you eat, make lifestyle changes, or try home treatments. If these do not help, procedures can be done to remove the hemorrhoids or make them smaller. These may involve:  Placing rubber bands at the base of the hemorrhoids to cut off their blood supply.  Injecting medicine into the hemorrhoids to shrink them.  Shining a type of light energy onto the hemorrhoids to cause them to fall off.  Doing surgery to remove the hemorrhoids or cut off their blood supply. Follow these instructions at home: Eating and drinking   Eat foods that have a lot of fiber in them. These include whole grains, beans, nuts, fruits, and vegetables.  Ask your doctor about taking products that have added fiber (fibersupplements).  Reduce the amount of fat in your diet. You can do this by: ? Eating low-fat dairy products. ? Eating less red meat. ?  Avoiding processed foods.  Drink enough fluid to keep your pee (urine) pale yellow. Managing pain and swelling   Take a warm-water bath (sitz bath) for 20 minutes to ease pain. Do this 3-4 times a day. You may do this in a bathtub or using a portable sitz bath that fits over the toilet.  If told, put ice on the painful area. It may be helpful to use ice between your warm baths. ? Put ice in a plastic bag. ? Place a towel between your skin and the bag. ? Leave the ice on for 20  minutes, 2-3 times a day. General instructions  Take over-the-counter and prescription medicines only as told by your doctor. ? Medicated creams and medicines may be used as told.  Exercise often. Ask your doctor how much and what kind of exercise is best for you.  Go to the bathroom when you have the urge to poop. Do not wait.  Avoid pushing too hard when you poop.  Keep your butt dry and clean. Use wet toilet paper or moist towelettes after pooping.  Do not sit on the toilet for a long time.  Keep all follow-up visits as told by your doctor. This is important. Contact a doctor if you:  Have pain and swelling that do not get better with treatment or medicine.  Have trouble pooping.  Cannot poop.  Have pain or swelling outside the area of the hemorrhoids. Get help right away if you have:  Bleeding that will not stop. Summary  Hemorrhoids are swollen veins in the butt or around the opening of the butt.  They can cause pain, itching, or bleeding.  Eat foods that have a lot of fiber in them. These include whole grains, beans, nuts, fruits, and vegetables.  Take a warm-water bath (sitz bath) for 20 minutes to ease pain. Do this 3-4 times a day. This information is not intended to replace advice given to you by your health care provider. Make sure you discuss any questions you have with your health care provider. Document Released: 07/01/2008 Document Revised: 02/11/2018 Document Reviewed: 02/11/2018 Elsevier Interactive Patient Education  2019 Elsevier Inc.  Surgical Procedures for Hemorrhoids, Care After Refer to this sheet in the next few weeks. These instructions provide you with information about caring for yourself after your procedure. Your health care provider may also give you more specific instructions. Your treatment has been planned according to current medical practices, but problems sometimes occur. Call your health care provider if you have any problems or  questions after your procedure. What can I expect after the procedure? After the procedure, it is common to have:  Rectal pain.  Pain when you are having a bowel movement.  Slight rectal bleeding. Follow these instructions at home: Medicines  Take over-the-counter and prescription medicines only as told by your health care provider.  Do not drive or operate heavy machinery while taking prescription pain medicine.  Use a stool softener or a bulk laxative as told by your health care provider. Activity  Rest at home. Return to your normal activities as told by your health care provider.  Do not lift anything that is heavier than 10 lb (4.5 kg).  Do not sit for long periods of time. Take a walk every day or as told by your health care provider.  Do not strain to have a bowel movement. Do not spend a long time sitting on the toilet. Eating and drinking  Eat foods that contain fiber, such  as whole grains, beans, nuts, fruits, and vegetables.  Drink enough fluid to keep your urine clear or pale yellow. General instructions  Sit in a warm bath 2-3 times per day to relieve soreness or itching.  Keep all follow-up visits as told by your health care provider. This is important. Contact a health care provider if:  Your pain medicine is not helping.  You have a fever or chills.  You become constipated.  You have trouble passing urine. Get help right away if:  You have very bad rectal pain.  You have heavy bleeding from your rectum. This information is not intended to replace advice given to you by your health care provider. Make sure you discuss any questions you have with your health care provider. Document Released: 12/13/2003 Document Revised: 02/28/2016 Document Reviewed: 12/18/2014 Elsevier Interactive Patient Education  2019 [employer].

## 2019-03-10 NOTE — Progress Notes (Signed)
Surgical Clinic History and Physical  Referring provider:  No referring provider defined for this encounter.  HISTORY OF PRESENT ILLNESS (HPI):  33 y.o. female presents for evaluation of her hemorrhoids. Patient reports she previously experienced only a single small bothersome hemorrhoid during college, for which she recalls having called her mom with concern at the time, but not until her most recent second pregnancy did she experience much worsened pain and bleeding with much more extensive circumferential hemorrhoids. She describes that both the pain and bleeding have unfortunately persisted over the past 18 months since childbirth, having only improved somewhat over the past 3 days. Still she says she is still unable to wipe without the sensation of incomplete defecation due to her hemorrhoids. She otherwise denies any constipation, N/V, fever/chills, CP, or SOB.  PAST MEDICAL HISTORY (PMH):  Past Medical History:  Diagnosis Date  . Anxiety   . Asthma    as a child  . Depression   . Gestational diabetes   . Herpes genitalis   . History of abnormal cervical Pap smear   . Obesity   . PTSD (post-traumatic stress disorder)   . PTSD (post-traumatic stress disorder)     PAST SURGICAL HISTORY (PSH):  Past Surgical History:  Procedure Laterality Date  . NO PAST SURGERIES      MEDICATIONS:  Prior to Admission medications   Medication Sig Start Date End Date Taking? Authorizing Provider  hydrOXYzine (ATARAX/VISTARIL) 25 MG tablet TAKE 1 TABLET BY MOUTH EVERY 6 HOURS AS NEEDED FOR ANXIETY 02/25/19  Yes Vena Austria, MD  levonorgestrel (MIRENA) 20 MCG/24HR IUD 1 each by Intrauterine route once.   Yes [provider]  omeprazole (PRILOSEC) 20 MG capsule Take 20 mg by mouth daily.   Yes [provider]  sertraline (ZOLOFT) 100 MG tablet Take 1 tablet (100 mg total) by mouth daily. 11/08/18  Yes Schuman, Christanna R, MD  triamcinolone ointment (KENALOG) 0.1 % Apply 1  application topically 2 (two) times daily. 03/04/19  Yes Payton Mccallum, MD  sertraline (ZOLOFT) 25 MG tablet TAKE 1 TABLET BY MOUTH DAILY Patient not taking: Reported on 03/10/2019 11/08/18   Natale Milch, MD    ALLERGIES:  No Known Allergies   SOCIAL HISTORY:  Social History   Socioeconomic History  . Marital status: Married    Spouse name: Not on file  . Number of children: 1  . Years of education: Not on file  . Highest education level: Not on file  Occupational History  . Not on file  Social Needs  . Financial resource strain: Not on file  . Food insecurity:    Worry: Not on file    Inability: Not on file  . Transportation needs:    Medical: Not on file    Non-medical: Not on file  Tobacco Use  . Smoking status: Former Smoker    Last attempt to quit: 12/28/2014    Years since quitting: 4.2  . Smokeless tobacco: Never Used  Substance and Sexual Activity  . Alcohol use: Yes    Comment: rarely  . Drug use: Yes    Types: Marijuana    Comment: History of daily marijuana use prior to pregnancy  . Sexual activity: Yes    Partners: Male    Birth control/protection: I.U.D.  Lifestyle  . Physical activity:    Days per week: 0 days    Minutes per session: 0 min  . Stress: Very much  Relationships  . Social connections:  Talks on phone: More than three times a week    Gets together: Twice a week    Attends religious service: Not on file    Active member of club or organization: No    Attends meetings of clubs or organizations: Never    Relationship status: Separated  . Intimate partner violence:    Fear of current or ex partner: No    Emotionally abused: No    Physically abused: No    Forced sexual activity: No  Other Topics Concern  . Not on file  Social History Narrative  . Not on file    The patient currently resides (home / rehab facility / nursing home): Home The patient normally is (ambulatory / bedbound): Ambulatory  FAMILY HISTORY:  Family  History  Problem Relation Age of Onset  . Multiple sclerosis Mother   . Diabetes Mellitus II Mother   . Hypertension Mother   . Breast cancer Maternal Aunt   . Breast cancer Paternal Aunt   . Breast cancer Paternal Grandmother   . Diabetes Father     Otherwise negative/non-contributory.  REVIEW OF SYSTEMS:  Constitutional: denies any other weight loss, fever, chills, or sweats  Eyes: denies any other vision changes, history of eye injury  ENT: denies sore throat, hearing problems  Respiratory: denies shortness of breath, wheezing  Cardiovascular: denies chest pain, palpitations  Gastrointestinal: abdominal pain, N/V, and bowel function as per HPI Musculoskeletal: denies any other joint pains or cramps  Skin: Denies any other rashes or skin discolorations Neurological: denies any other headache, dizziness, weakness  Psychiatric: Denies any other depression, anxiety   All other review of systems were otherwise negative   VITAL SIGNS:  BP 139/81   Pulse 88   Temp 98.1 F (36.7 C)   Ht 5\' 7"  (1.702 m)   Wt 276 lb (125.2 kg)   SpO2 98%   BMI 43.23 kg/m   PHYSICAL EXAM:  Constitutional:  -- Obese body habitus  -- Awake, alert, and oriented x3  Eyes:  -- Pupils equally round and reactive to light  -- No scleral icterus  Ear, nose, throat:  -- No jugular venous distension -- No nasal drainage, bleeding Pulmonary:  -- No crackles  -- Equal breath sounds bilaterally -- Breathing non-labored at rest Cardiovascular:  -- S1, S2 present  -- No pericardial rubs  Gastrointestinal:  -- Abdomen soft, non-tender to palpation, non-distended, no guarding/rebound tenderness -- No abdominal masses appreciated, pulsatile or otherwise  Anorectal: -- Moderately tender to palpation circumferential non-reducible not-currently-bleeding hemorrhoids x4 -- No fissure(s), fistula, or mass otherwise appreciated with anal sphincter tone WNL Musculoskeletal and Integumentary:  -- Wounds or  skin discoloration: None appreciated -- Extremities: B/L UE and LE FROM, hands and feet warm Neurologic:  -- Motor function: Intact and symmetric -- Sensation: Intact and symmetric  Labs:  CBC Latest Ref Rng & Units 08/08/2017 08/06/2017 05/28/2017  WBC 3.6 - 11.0 K/uL 13.1(H) 13.6(H) 12.6(H)  Hemoglobin 12.0 - 16.0 g/dL 1.6(X9.6(L) 10.9(L) 10.4(L)  Hematocrit 35.0 - 47.0 % 29.5(L) 32.9(L) 33.4(L)  Platelets 150 - 440 K/uL 216 227 273   CMP Latest Ref Rng & Units 08/06/2017 05/25/2017 08/15/2013  Glucose 65 - 99 mg/dL 80 096(E128(H) 454(U114(H)  BUN 6 - 20 mg/dL 11 8 9   Creatinine 0.44 - 1.00 mg/dL 9.810.45 1.910.50 4.780.72  Sodium 135 - 145 mmol/L 134(L) 136 136  Potassium 3.5 - 5.1 mmol/L 3.9 3.7 3.6  Chloride 101 - 111 mmol/L 106 106 106  CO2 22 - 32 mmol/L 21(L) 21(L) 23  Calcium 8.9 - 10.3 mg/dL 9.1 9.1 9.4  Total Protein 6.5 - 8.1 g/dL 6.9 6.6 8.0  Total Bilirubin 0.3 - 1.2 mg/dL 0.5 1.6(X) 0.3  Alkaline Phos 38 - 126 U/L 157(H) 80 97  AST 15 - 41 U/L 19 21 16   ALT 14 - 54 U/L 14 10(L) 31   Imaging studies: No new pertinent imaging studies available for review at this time   Assessment/Plan:  33 y.o. 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   - maintain hydration and high-fiber diet +/- daily stool softener if needed  - currently does not require or request any additional pain management regimen  - all risks, benefits, alternatives to, and anticipated recovery following hemorrhoidectomy were discussed with the patient, all of her questions were answered to her expressed satisfaction, patient expresses she wishes to proceed, and informed consent was obtained.   - will plan for extensive  hemorrhoidectomy [facility] in am on 6/24 as requested  - anticipate return to clinic 2 weeks following above elective procedure  - instructed to call if any questions or concerns  All of the above recommendations were discussed with the patient, and all of patient's questions were answered to her expressed satisfaction.  Thank you for the opportunity to participate in this patient's care.  -- Scherrie Gerlach Earlene Plater, MD, RPVI [place]: [facility] - Partnering for exceptional care. Office: [phone]

## 2019-03-11 ENCOUNTER — Encounter: Payer: Self-pay | Admitting: Surgery

## 2019-03-11 DIAGNOSIS — K642 Third degree hemorrhoids: Secondary | ICD-10-CM

## 2019-03-11 HISTORY — DX: Third degree hemorrhoids: K64.2

## 2019-03-23 ENCOUNTER — Other Ambulatory Visit: Payer: Self-pay

## 2019-03-23 ENCOUNTER — Encounter: Payer: Self-pay | Admitting: Anesthesiology

## 2019-03-23 ENCOUNTER — Encounter: Payer: Self-pay | Admitting: *Deleted

## 2019-03-25 ENCOUNTER — Other Ambulatory Visit: Admission: RE | Admit: 2019-03-25 | Payer: Medicaid Other | Source: Ambulatory Visit

## 2019-03-29 ENCOUNTER — Telehealth: Payer: Self-pay | Admitting: *Deleted

## 2019-03-29 NOTE — Telephone Encounter (Signed)
Patient told Maudie Mercury with the [facility] that she would not be having surgery with Dr. Rosana Hoes as scheduled for tomorrow, 03-30-19. Kim reports patient did not have COVID testing done and has an issue with insurance.   The patient was contacted today and she states she is having trouble getting Medicaid straightened out and she will need to hold off on surgery until this has been worked out. The patient will call our office when she is ready to proceed with surgery.   Note routed to Dr. Rosana Hoes.

## 2019-03-30 ENCOUNTER — Telehealth: Payer: Self-pay | Admitting: *Deleted

## 2019-03-30 ENCOUNTER — Ambulatory Visit: Admission: RE | Admit: 2019-03-30 | Payer: Medicaid Other | Source: Home / Self Care | Admitting: Surgery

## 2019-03-30 HISTORY — DX: Motion sickness, initial encounter: T75.3XXA

## 2019-03-30 SURGERY — REPAIR, HERNIA, VENTRAL
Anesthesia: General

## 2019-03-30 NOTE — Telephone Encounter (Signed)
Message left for patient to call the office.   We need to let her know that Dr. Rosana Hoes would be available to do her surgery on 04-08-19 at present. She would need COVID testing done on 04-05-19 if she would like to get her surgery rescheduled for this date. Not sure if patient will be able to get her insurance straightened out prior to that date. If patient prefers, she can call us back to reschedule once insurance issue is resolved.

## 2019-11-13 ENCOUNTER — Other Ambulatory Visit: Payer: Self-pay | Admitting: Obstetrics and Gynecology

## 2019-11-13 DIAGNOSIS — F53 Postpartum depression: Secondary | ICD-10-CM

## 2020-12-12 ENCOUNTER — Other Ambulatory Visit: Payer: Self-pay | Admitting: Obstetrics and Gynecology

## 2020-12-12 DIAGNOSIS — F53 Postpartum depression: Secondary | ICD-10-CM

## 2021-06-28 ENCOUNTER — Encounter: Payer: Self-pay | Admitting: General Surgery

## 2021-12-31 LAB — FETAL NONSTRESS TEST

## 2022-01-15 ENCOUNTER — Encounter: Payer: Self-pay | Admitting: Obstetrics and Gynecology

## 2022-01-15 DIAGNOSIS — Z3009 Encounter for other general counseling and advice on contraception: Secondary | ICD-10-CM

## 2022-04-09 ENCOUNTER — Encounter: Payer: Self-pay | Admitting: Obstetrics and Gynecology

## 2022-04-09 DIAGNOSIS — Z30431 Encounter for routine checking of intrauterine contraceptive device: Secondary | ICD-10-CM

## 2022-04-09 DIAGNOSIS — Z7689 Persons encountering health services in other specified circumstances: Secondary | ICD-10-CM

## 2022-10-06 DIAGNOSIS — A749 Chlamydial infection, unspecified: Secondary | ICD-10-CM

## 2022-10-06 HISTORY — DX: Chlamydial infection, unspecified: A74.9

## 2022-10-20 DIAGNOSIS — Z8616 Personal history of COVID-19: Secondary | ICD-10-CM

## 2022-10-20 HISTORY — DX: Personal history of COVID-19: Z86.16

## 2023-01-08 ENCOUNTER — Ambulatory Visit: Payer: Medicaid Other | Admitting: Family Medicine

## 2023-02-13 ENCOUNTER — Telehealth: Payer: Self-pay | Admitting: Family Medicine

## 2023-02-16 ENCOUNTER — Ambulatory Visit (INDEPENDENT_AMBULATORY_CARE_PROVIDER_SITE_OTHER): Payer: Medicaid Other | Admitting: Family Medicine

## 2023-02-16 ENCOUNTER — Other Ambulatory Visit (HOSPITAL_COMMUNITY)
Admission: RE | Admit: 2023-02-16 | Discharge: 2023-02-16 | Disposition: A | Discharge status: Home / Self Care | Payer: Medicaid Other | Source: Ambulatory Visit | Attending: Family Medicine | Admitting: Family Medicine

## 2023-02-16 ENCOUNTER — Encounter: Payer: Self-pay | Admitting: Family Medicine

## 2023-02-16 VITALS — BP 120/75 | HR 79 | Temp 98.2°F | Resp 15 | Ht 67.0 in | Wt 269.8 lb

## 2023-02-16 DIAGNOSIS — F3289 Other specified depressive episodes: Secondary | ICD-10-CM

## 2023-02-16 DIAGNOSIS — Z01411 Encounter for gynecological examination (general) (routine) with abnormal findings: Secondary | ICD-10-CM

## 2023-02-16 DIAGNOSIS — Z0000 Encounter for general adult medical examination without abnormal findings: Secondary | ICD-10-CM

## 2023-02-16 DIAGNOSIS — B009 Herpesviral infection, unspecified: Secondary | ICD-10-CM

## 2023-02-16 DIAGNOSIS — Z6841 Body Mass Index (BMI) 40.0 and over, adult: Secondary | ICD-10-CM

## 2023-02-16 DIAGNOSIS — N760 Acute vaginitis: Secondary | ICD-10-CM | POA: Insufficient documentation

## 2023-02-16 DIAGNOSIS — Z7251 High risk heterosexual behavior: Secondary | ICD-10-CM | POA: Insufficient documentation

## 2023-02-16 DIAGNOSIS — B9689 Other specified bacterial agents as the cause of diseases classified elsewhere: Secondary | ICD-10-CM

## 2023-02-16 DIAGNOSIS — K642 Third degree hemorrhoids: Secondary | ICD-10-CM | POA: Diagnosis not present

## 2023-02-16 DIAGNOSIS — E739 Lactose intolerance, unspecified: Secondary | ICD-10-CM

## 2023-02-16 MED ORDER — METRONIDAZOLE 500 MG PO TABS: 500.0000 mg | ORAL_TABLET | Freq: Two times a day (BID) | ORAL | 0 refills | Status: DC

## 2023-02-16 MED ORDER — VALACYCLOVIR HCL 500 MG PO TABS: 500.0000 mg | ORAL_TABLET | Freq: Two times a day (BID) | ORAL | 0 refills | Status: DC

## 2023-02-16 NOTE — Assessment & Plan Note (Signed)
Currently well-managed with sertraline; will refill when needed.

## 2023-02-16 NOTE — Progress Notes (Unsigned)
I,Carolyn  Rosales,acting as a Neurosurgeon for [employer], DO.,have documented all relevant documentation on the behalf of [employer], DO,as directed by  [employer], DO while in the presence of Carolyn Schank N Chesney Suares, DO.   New patient visit   Patient: Carolyn Rosales   DOB: [birth year]   36 y.o. Female  MRN: [id number] Visit Date: 02/16/2023  Today's healthcare provider: Sherlyn Hay, DO   Chief Complaint  Patient presents with   New Patient (Initial Visit)   Subjective    Carolyn Rosales is a 37 y.o. female who presents today as a new patient to establish care.   HPI HPI   Patient also states history of hemmorroids. Last edited by Lubertha Basque, CMA on 02/16/2023  1:41 PM.      Patient is here today wanting to establish care, [place]. Patient also believes she may have yeast infection but is unsure and would like to checked for it. Patient states did not have regular PCP.   Having pubic discomfort, but only after having sex and lasts a few days. It depends on how long the sex lasts; was with same guy for eight years but broke up. Has been "a little crazy this year"  Occasional white discharge noted (unclear where from).   Does have hemorrhoids; was supposed to get it "fixed."  Didn't have the surgery because she was afraid; tried to change her diet. Endorses hemorrhoid is consistently present.  Last PAP - during previous pregnancy; HPV positive in 2018 (not 16/18; other high risk).  Got herpes when she was 16 but also never gets flare-ups anymore; concerned it might be related and would like a prescription for it  Back pain in lower back; takes tyelnol  Blood in toilet from hemorrhoids; at least once per week. Frequently occurs when she doesn't stick to her diet (lactose intolerant).   Past Medical History:  Diagnosis Date   Anxiety    Asthma    as a child   Depression    Gestational diabetes    Herpes genitalis    History  of abnormal cervical Pap smear    Motion sickness    ocean boats   Obesity    PTSD (post-traumatic stress disorder)    PTSD (post-traumatic stress disorder)    Past Surgical History:  Procedure Laterality Date   NO PAST SURGERIES     Family Status  Relation Name Status   Mother  Alive   Mat Aunt  (Not Specified)   Emelda Brothers  (Not Specified)   PGM  (Not Specified)   Father  Alive   Family History  Problem Relation Age of Onset   Multiple sclerosis Mother    Diabetes Mellitus II Mother    Hypertension Mother    Breast cancer Maternal Aunt    Breast cancer Paternal Aunt    Breast cancer Paternal Grandmother    Diabetes Father    Social History   Socioeconomic History   Marital status: Married    Spouse name: Not on file   Number of children: 1   Years of education: Not on file   Highest education level: Not on file  Occupational History   Not on file  Tobacco Use   Smoking status: Former    Types: Cigarettes    Quit date: 12/28/2014    Years since quitting: 8.1   Smokeless tobacco: Never  Vaping Use   Vaping Use: Every  day   Substances: THC, CBD  Substance and Sexual Activity   Alcohol use: Yes    Comment: rarely   Drug use: Yes    Types: Marijuana    Comment: History of daily marijuana use prior to pregnancy   Sexual activity: Yes    Partners: Male    Birth control/protection: I.U.D.  Other Topics Concern   Not on file  Social History Narrative   Not on file   Social Determinants of Health   Financial Resource Strain: Not on file  Food Insecurity: Not on file  Transportation Needs: Not on file  Physical Activity: Inactive (12/28/2018)   Exercise Rosales Sign    Days of Exercise per Week: 0 days    Minutes of Exercise per Session: 0 min  Stress: Stress Concern Present (12/28/2018)   [employer] of Occupational Health - Occupational Stress Questionnaire    Feeling of Stress : Very much  Social Connections: Unknown (12/28/2018)   Social Connection  and Isolation Panel [NHANES]    Frequency of Communication with Friends and Family: More than three times a week    Frequency of Social Gatherings with Friends and Family: Twice a week    Attends Religious Services: Not on Marketing executive or Organizations: No    Attends Banker Meetings: Never    Marital Status: Separated   Outpatient Medications Prior to Visit  Medication Sig   sertraline (ZOLOFT) 100 MG tablet TAKE 1 TABLET BY MOUTH DAILY   hydrOXYzine (ATARAX/VISTARIL) 25 MG tablet TAKE 1 TABLET BY MOUTH EVERY 6 HOURS AS NEEDED FOR ANXIETY (Patient not taking: Reported on 02/16/2023)   levonorgestrel (MIRENA) 20 MCG/24HR IUD 1 each by Intrauterine route once. (Patient not taking: Reported on 02/16/2023)   omeprazole (PRILOSEC) 20 MG capsule Take 20 mg by mouth daily. (Patient not taking: Reported on 02/16/2023)   sertraline (ZOLOFT) 25 MG tablet TAKE 1 TABLET BY MOUTH DAILY (Patient not taking: No sig reported)   triamcinolone ointment (KENALOG) 0.1 % Apply 1 application topically 2 (two) times daily. (Patient not taking: Reported on 02/16/2023)   No facility-administered medications prior to visit.   No Known Allergies  Immunization History  Administered Date(s) Administered   Influenza,inj,Quad PF,6+ Mos 08/09/2017   Tdap 08/09/2017    Health Maintenance  Topic Date Due   COVID-19 Vaccine (1) Never done   Hepatitis C Screening  Never done   PAP SMEAR-Modifier  09/24/2020   INFLUENZA VACCINE  05/07/2023   DTaP/Tdap/Td (2 - Td or Tdap) 08/10/2027   HIV Screening  Completed   HPV VACCINES  Aged Out    Patient Care Team: Bernise Sylvain, Monico Blitz, DO as PCP - General (Family Medicine)  Review of Systems  Gastrointestinal:  Positive for anal bleeding.  Musculoskeletal:  Positive for back pain.  Psychiatric/Behavioral:  Positive for sleep disturbance.   All other systems reviewed and are negative.   {Labs  Heme  Chem  Endocrine  Serology  Results  Review (optional):23779}   Objective    BP 120/75 (BP Location: Left Arm, Patient Position: Sitting, Cuff Size: Large)   Pulse 79   Temp 98.2 F (36.8 C) (Oral)   Resp 15   Ht 5\' 7"  (1.702 m)   Wt 269 lb 12.8 oz (122.4 kg)   SpO2 97%   BMI 42.26 kg/m  {Show previous Rosales signs (optional):23777}  Physical Exam ***  Depression Screen    02/16/2023    1:36 PM 11/11/2017  2:24 PM 07/28/2017   11:02 AM  PHQ 2/9 Scores  PHQ - 2 Score 0 1 1  PHQ- 9 Score 1 2    No results found for any visits on 02/16/23.  Assessment & Plan     ***  No follow-ups on file.     {provider attestation***:1}   Sherlyn Hay, DO  [facility] [phone] (phone) [phone] (fax)  [facility]

## 2023-02-16 NOTE — Assessment & Plan Note (Signed)
Will send in valtrex to have on hand for breakout

## 2023-02-17 DIAGNOSIS — E739 Lactose intolerance, unspecified: Secondary | ICD-10-CM | POA: Insufficient documentation

## 2023-02-17 LAB — LIPID PANEL
Chol/HDL Ratio: 4.9 ratio — ABNORMAL HIGH (ref 0.0–4.4)
Cholesterol, Total: 172 mg/dL (ref 100–199)
HDL: 35 mg/dL — ABNORMAL LOW (ref >39)
LDL Chol Calc (NIH): 101 mg/dL — ABNORMAL HIGH (ref 0–99)
Triglycerides: 206 mg/dL — ABNORMAL HIGH (ref 0–149)
VLDL Cholesterol Cal: 36 mg/dL (ref 5–40)

## 2023-02-17 LAB — COMPREHENSIVE METABOLIC PANEL
ALT: 35 IU/L — ABNORMAL HIGH (ref 0–32)
AST: 26 IU/L (ref 0–40)
Albumin/Globulin Ratio: 1.6 (ref 1.2–2.2)
Albumin: 4.3 g/dL (ref 3.9–4.9)
Alkaline Phosphatase: 76 IU/L (ref 44–121)
BUN/Creatinine Ratio: 14 (ref 9–23)
BUN: 10 mg/dL (ref 6–20)
Bilirubin Total: 0.5 mg/dL (ref 0.0–1.2)
CO2: 22 mmol/L (ref 20–29)
Calcium: 9.7 mg/dL (ref 8.7–10.2)
Chloride: 104 mmol/L (ref 96–106)
Creatinine, Ser: 0.7 mg/dL (ref 0.57–1.00)
Globulin, Total: 2.7 g/dL (ref 1.5–4.5)
Glucose: 93 mg/dL (ref 70–99)
Potassium: 4.1 mmol/L (ref 3.5–5.2)
Sodium: 142 mmol/L (ref 134–144)
Total Protein: 7 g/dL (ref 6.0–8.5)
eGFR: 114 mL/min/{1.73_m2} (ref >59)

## 2023-02-17 LAB — HIV ANTIBODY (ROUTINE TESTING W REFLEX): HIV Screen 4th Generation wRfx: NONREACTIVE

## 2023-02-17 LAB — HCV INTERPRETATION

## 2023-02-17 LAB — HCV AB W REFLEX TO QUANT PCR: HCV Ab: NONREACTIVE

## 2023-02-17 LAB — RPR: RPR Ser Ql: NONREACTIVE

## 2023-02-25 LAB — CERVICOVAGINAL ANCILLARY ONLY
Chlamydia: POSITIVE — AB
Comment: NEGATIVE
Comment: NEGATIVE
Comment: NEGATIVE
Comment: NORMAL
High risk HPV: NEGATIVE
Neisseria Gonorrhea: NEGATIVE
Trichomonas: NEGATIVE

## 2023-02-26 ENCOUNTER — Ambulatory Visit (INDEPENDENT_AMBULATORY_CARE_PROVIDER_SITE_OTHER): Payer: Medicaid Other | Admitting: Surgery

## 2023-02-26 ENCOUNTER — Other Ambulatory Visit: Payer: Self-pay | Admitting: Physician Assistant

## 2023-02-26 ENCOUNTER — Encounter: Payer: Self-pay | Admitting: Surgery

## 2023-02-26 VITALS — BP 130/87 | HR 78 | Ht 67.0 in | Wt 267.0 lb

## 2023-02-26 DIAGNOSIS — A749 Chlamydial infection, unspecified: Secondary | ICD-10-CM

## 2023-02-26 DIAGNOSIS — K644 Residual hemorrhoidal skin tags: Secondary | ICD-10-CM

## 2023-02-26 DIAGNOSIS — K648 Other hemorrhoids: Secondary | ICD-10-CM

## 2023-02-26 HISTORY — DX: Residual hemorrhoidal skin tags: K64.4

## 2023-02-26 MED ORDER — AMOXICILLIN 500 MG PO CAPS: 500.0000 mg | ORAL_CAPSULE | Freq: Three times a day (TID) | ORAL | 0 refills | Status: DC

## 2023-02-26 NOTE — Progress Notes (Signed)
Patient ID: Carolyn Rosales, female   DOB: 1986-03-25, 37 y.o.   MRN: [id number]  Chief Complaint: Hemorrhoids  History of Present Illness Carolyn Rosales is a 37 y.o. female with a 5-year history of hemorrhoids.  She denies any particular flareup to consist of a recent episode.  She denies any prior history of thrombosis.  She reports her bowel function can vary from skipping days to loose stools, seems to be heavy at the affected by diet including dairy products.  No significant history of fiber intake.  She reports seeing some blood with a bowel movement, typically bright red 2-3 times weekly.  Reports burning only with flareups.  He has utilized sitz bath's primarily to assist with hygiene.  He denies any topical medications, oral laxatives to help manage her bowel.  Family history of colon cancer, no history of colonoscopy.  Past Medical History Past Medical History:  Diagnosis Date   Anxiety    Asthma    as a child   Depression    Gestational diabetes    Herpes genitalis    History of abnormal cervical Pap smear    Lactose intolerance    Motion sickness    ocean boats   Obesity    PTSD (post-traumatic stress disorder)    PTSD (post-traumatic stress disorder)       Past Surgical History:  Procedure Laterality Date   NO PAST SURGERIES      No Known Allergies  Current Outpatient Medications  Medication Sig Dispense Refill   levonorgestrel (MIRENA) 20 MCG/24HR IUD 1 each by Intrauterine route once.     metroNIDAZOLE (FLAGYL) 500 MG tablet Take 1 tablet (500 mg total) by mouth 2 (two) times daily. 14 tablet 0   omeprazole (PRILOSEC) 20 MG capsule Take 20 mg by mouth daily.     sertraline (ZOLOFT) 100 MG tablet TAKE 1 TABLET BY MOUTH DAILY 30 tablet 3   No current facility-administered medications for this visit.    Family History Family History  Problem Relation Age of Onset   Multiple sclerosis Mother    Diabetes Mellitus II Mother    Hypertension  Mother    Breast cancer Maternal Aunt    Breast cancer Paternal Aunt    Breast cancer Paternal Grandmother    Diabetes Father       Social History Social History   Tobacco Use   Smoking status: Former    Types: Cigarettes    Quit date: 12/28/2014    Years since quitting: 8.1    Passive exposure: Past   Smokeless tobacco: Never  Vaping Use   Vaping Use: Every day   Substances: THC, CBD  Substance Use Topics   Alcohol use: Yes    Comment: rarely   Drug use: Yes    Types: Marijuana    Comment: History of daily marijuana use prior to pregnancy        Review of Systems  Constitutional: Negative.   HENT: Negative.    Respiratory: Negative.    Cardiovascular: Negative.   Gastrointestinal:  Positive for blood in stool.  Genitourinary: Negative.   Skin: Negative.   Neurological: Negative.   Psychiatric/Behavioral: Negative.       Physical Exam Blood pressure 130/87, pulse 78, height 5\' 7"  (1.702 m), weight 267 lb (121.1 kg), SpO2 98 %. Last Weight  Most recent update: 02/26/2023  3:14 PM    Weight  121.1 kg (267 lb)  CONSTITUTIONAL: Well developed, and nourished, appropriately responsive and aware without distress.   EYES: Sclera non-icteric.   EARS, NOSE, MOUTH AND THROAT:  The oropharynx is clear. Oral mucosa is pink and moist.   Hearing is intact to voice.  NECK: Trachea is midline, and there is no jugular venous distension.  LYMPH NODES:  Lymph nodes in the neck are not appreciated. RESPIRATORY:  Normal respiratory effort without pathologic use of accessory muscles. CARDIOVASCULAR:  Well perfused.  GI: The abdomen is soft, nontender, and nondistended.  GU: [name] present as chaperone.  Prominent but not inflamed external hemorrhoidal tags, nearly circumferential.  Prominent internal hemorrhoids without remarkable pile formation, or multiple redundancy to suggest prolapsing internal hemorrhoids.  No bright red blood on exam.  Mild rectocele.  No  distal rectal mucosal polyps.  Nontender on exam. MUSCULOSKELETAL:  Symmetrical muscle tone appreciated in all four extremities.    SKIN: Skin turgor is normal. No pathologic skin lesions appreciated.  NEUROLOGIC:  Motor and sensation appear grossly normal.  Cranial nerves are grossly without defect. PSYCH:  Alert and oriented to person, place and time. Affect is appropriate for situation.  Data Reviewed I have personally reviewed what is currently available of the patient's imaging, recent labs and medical records.   Labs:     Latest Ref Rng & Units 08/08/2017    5:33 AM 08/06/2017    4:42 PM 05/28/2017   10:54 AM  CBC  WBC 3.6 - 11.0 K/uL 13.1  13.6  12.6   Hemoglobin 12.0 - 16.0 g/dL 9.6  16.1  09.6   Hematocrit 35.0 - 47.0 % 29.5  32.9  33.4   Platelets 150 - 440 K/uL 216  227  273       Latest Ref Rng & Units 02/16/2023    2:57 PM 08/06/2017    4:42 PM 05/25/2017    1:16 PM  CMP  Glucose 70 - 99 mg/dL 93  80  045   BUN 6 - 20 mg/dL 10  11  8    Creatinine 0.57 - 1.00 mg/dL 4.09  8.11  9.14   Sodium 134 - 144 mmol/L 142  134  136   Potassium 3.5 - 5.2 mmol/L 4.1  3.9  3.7   Chloride 96 - 106 mmol/L 104  106  106   CO2 20 - 29 mmol/L 22  21  21    Calcium 8.7 - 10.2 mg/dL 9.7  9.1  9.1   Total Protein 6.0 - 8.5 g/dL 7.0  6.9  6.6   Total Bilirubin 0.0 - 1.2 mg/dL 0.5  0.5  0.2   Alkaline Phos 44 - 121 IU/L 76  157  80   AST 0 - 40 IU/L 26  19  21    ALT 0 - 32 IU/L 35  14  10       Imaging: Radiological images reviewed:   Within last 24 hrs: No results found.  Assessment    Internal and external hemorrhoids.  Patient Active Problem List   Diagnosis Date Noted   Lactose intolerance in adult 02/17/2023   Herpes simplex type 2 infection 02/16/2023   Grade III hemorrhoids 03/11/2019   Postpartum depression 08/21/2017   Postpartum care following vaginal delivery 08/21/2017   Urinary incontinence in female 08/11/2017   Breast feeding status of mother 08/11/2017    Indication for care in labor and delivery, antepartum 08/06/2017   Labor and delivery, indication for care 08/06/2017   Gestational diabetes 07/15/2017   BMI  40.0-44.9, adult (HCC) 07/01/2017   Substance abuse affecting pregnancy, antepartum (HCC) 05/13/2017   Herpes genitalis 04/13/2017   History of abnormal cervical Pap smear 04/13/2017   Depression 04/09/2017   Anxiety 04/09/2017   Maternal obesity, antepartum 04/09/2017   Supervision of high risk pregnancy, antepartum 04/09/2017   Marijuana use 04/09/2017    Plan    She is definitely interested in performing conservative measures to alleviate/obtain some potential regression of hemorrhoids, we also discussed the potential for internal hemorrhoidal banding.  At present I believe she would like to obtain regularity in her bowel habits, and greater ease in perianal hygiene.  We discussed bidet use.  We advised getting aggressive with the below regimen.  Advised to pursue a goal of 25 to 30 g of fiber daily.  Made aware that the majority of this may be through natural sources, but advised to be aware of actual consumption and to ensure minimal consumption by daily supplementation.  Various forms of supplements discussed.  Recommended Psyllium husk, that mixes well with applesauce, or the powder which goes down well shaken with chocolate milk.  Strongly advised to consume more fluids to ensure adequate hydration, instructed to watch color of urine to determine adequacy of hydration.  Clarity is pursued in urine output, and bowel activity that correlates to significant meal intake.   We need to avoid deferring having bowel movements, advised to take the time at the first sign of sensation, typically following meals, and in the morning.   Subsequent utilization of MiraLAX may be needed ensure at least daily movement, ideally twice daily bowel movements.  If multiple doses of MiraLAX are necessary utilize them. Never skip a day...  To be regular,  we must do the above EVERY day.    She would like to give this a trial, and I suggested follow-up in 2 to 3 months or as needed. Face-to-face time spent with the patient and accompanying care providers(if present) was 30 minutes, with more than 50% of the time spent counseling, educating, and coordinating care of the patient.    These notes generated with voice recognition software. I apologize for typographical errors.  Campbell Lerner M.D., FACS 02/26/2023, 9:25 PM

## 2023-02-26 NOTE — Patient Instructions (Addendum)
Follow up here in 1-2 month for a reevaluation.   Advised to pursue a goal of 25 to 30 g of fiber daily.  Made aware that the majority of this may be through natural sources, but advised to be aware of actual consumption and to ensure minimal consumption by daily supplementation.  Various forms of supplements discussed.  Recommended Psyllium husk, that mixes well with applesauce, or the powder. You can take Metamucil or Benefiber, two to three times a day.  Strongly advised to consume more fluids to ensure adequate hydration, instructed to watch color of urine to determine adequacy of hydration. Clarity is pursued in urine output, and bowel activity that correlates to significant meal intake.   We need to avoid deferring having bowel movements, advised to take the time at the first sign of sensation, typically following meals, and in the morning.   Subsequent utilization of MiraLAX may be needed ensure at least daily movement, ideally twice daily bowel movements.  If multiple doses of MiraLAX are necessary utilize them. Never skip a day...  To be regular, we must do the above EVERY day.   Fiber Content in Foods Fiber is a substance that is found in plant foods, such as fruits, vegetables, whole grains, nuts, seeds, and beans. As part of your treatment and recovery plan, your health care provider may recommend that you eat foods that have specific amounts of dietary fiber. Some conditions may require a high-fiber diet while others may require a low-fiber diet. This sheet gives you information about the dietary fiber content of some common foods. Your health care provider will tell you how much fiber you need in your diet. If you have problems or questions, contact your health care provider or dietitian. What foods are high in fiber?  Fruits Blackberries or raspberries (fresh) --  cup (75 g) has 4 g of fiber. Pear (fresh) -- 1 medium (180 g) has 5.5 g of fiber. Prunes (dried) -- 6 to 8 pieces (57-76  g) has 5 g of fiber. Apple with skin -- 1 medium (182 g) has 4.8 g of fiber. Guava -- 1 cup (128 g) has 8.9 g of fiber. Vegetables Peas (frozen) --  cup (80 g) has 4.4 g of fiber. Potato with skin (baked) -- 1 medium (173 g) has 4.4 g of fiber. Pumpkin (canned) --  cup (122 g) has 5 g of fiber. Brussels sprouts (cooked) --  cup (78 g) has 4 g of fiber. Sweet potato --  cup mashed (124 g) has 4 g of fiber. Winter squash -- 1 cup cooked (205 g) has 5.7 g of fiber. Grains Bran cereal --  cup (31 g) has 8.6 g of fiber. Bulgur (cooked) --  cup (70 g) has 4 g of fiber. Quinoa (cooked) -- 1 cup (185 g) has 5.2 g of fiber. Popcorn -- 3 cups (375 g) popped has 5.8 g of fiber. Spaghetti, whole wheat -- 1 cup (140 g) has 6 g of fiber. Meats and other proteins Pinto beans (cooked) --  cup (90 g) has 7.7 g of fiber. Lentils (cooked) --  cup (90 g) has 7.8 g of fiber. Kidney beans (canned) --  cup (92.5 g) has 5.7 g of fiber. Soybeans (canned, frozen, or fresh) --  cup (92.5 g) has 5.2 g of fiber. Baked beans, plain or vegetarian (canned) --  cup (130 g) has 5.2 g of fiber. Garbanzo beans or chickpeas (canned) --  cup (90 g) has 6.6 g of fiber.  Black beans (cooked) --  cup (86 g) has 7.5 g of fiber. White beans or navy beans (cooked) --  cup (91 g) has 9.3 g of fiber. The items listed above may not be a complete list of foods with high fiber. Actual amounts of fiber may be different depending on processing. Contact a dietitian for more information. What foods are moderate in fiber?  Fruits Banana -- 1 medium (126 g) has 3.2 g of fiber. Melon -- 1 cup (155 g) has 1.4 g of fiber. Orange -- 1 small (154 g) has 3.7 g of fiber. Raisins --  cup (40 g) has 1.8 g of fiber. Applesauce, sweetened --  cup (125 g) has 1.5 g of fiber. Blueberries (fresh) --  cup (75 g) has 1.8 g of fiber. Strawberries (fresh, sliced) -- 1 cup (150 g) has 3 g of fiber. Cherries -- 1 cup (140 g) has 2.9 g of  fiber. Vegetables Broccoli (cooked) --  cup (77.5 g) has 2.1 g of fiber. Carrots (cooked) --  cup (77.5 g) has 2.2 g of fiber. Corn (canned or frozen) --  cup (82.5 g) has 2.1 g of fiber. Potatoes, mashed --  cup (105 g) has 1.6 g of fiber. Tomato -- 1 medium (62 g) has 1.5 g of fiber. Green beans (canned) --  cup (83 g) has 2 g of fiber. Squash, winter --  cup (58 g) has 1 g of fiber. Sweet potato, baked -- 1 medium (150 g) has 3 g of fiber. Cauliflower (cooked) -- 1/2 cup (90 g) has 2.3 g of fiber. Grains Long-grain brown rice (cooked) -- 1 cup (196 g) has 3.5 g of fiber. Bagel, plain -- one 4-inch (10 cm) bagel has 2 g of fiber. Instant oatmeal --  cup (120 g) has about 2 g of fiber. Macaroni noodles, enriched (cooked) -- 1 cup (140 g) has 2.5 g of fiber. Multigrain cereal --  cup (15 g) has about 2-4 g of fiber. Whole-wheat bread -- 1 slice (26 g) has 2 g of fiber. Whole-wheat spaghetti noodles --  cup (70 g) has 3.2 g of fiber. Corn tortilla -- one 6-inch (15 cm) tortilla has 1.5 g of fiber. Meats and other proteins Almonds --  cup or 1 oz (28 g) has 3.5 g of fiber. Sunflower seeds in shell --  cup or  oz (11.5 g) has 1.1 g of fiber. Vegetable or soy patty -- 1 patty (70 g) has 3.4 g of fiber. Walnuts --  cup or 1 oz (30 g) has 2 g of fiber. Flax seed -- 1 Tbsp (7 g) has 2.8 g of fiber. The items listed above may not be a complete list of foods that have moderate amounts of fiber. Actual amounts of fiber may be different depending on processing. Contact a dietitian for more information. What foods are low in fiber?  Low-fiber foods contain less than 1 g of fiber per serving. They include: Fruits Fruit juice --  cup or 4 fl oz (118 mL) has 0.5 g of fiber. Vegetables Lettuce -- 1 cup (35 g) has 0.5 g of fiber. Cucumber (slices) --  cup (60 g) has 0.3 g of fiber. Celery -- 1 stalk (40 g) has 0.1 g of fiber. Grains Flour tortilla -- one 6-inch (15 cm) tortilla has  0.5 g of fiber. White rice (cooked) --  cup (81.5 g) has 0.3 g of fiber. Meats and other proteins Egg -- 1 large (50 g) has 0  g of fiber. Meat, poultry, or fish -- 3 oz (85 g) has 0 g of fiber. Dairy Milk -- 1 cup or 8 fl oz (237 mL) has 0 g of fiber. Yogurt -- 1 cup (245 g) has 0 g of fiber. The items listed above may not be a complete list of foods that are low in fiber. Actual amounts of fiber may be different depending on processing. Contact a dietitian for more information. Summary Fiber is a substance that is found in plant foods, such as fruits, vegetables, whole grains, nuts, seeds, and beans. As part of your treatment and recovery plan, your health care provider may recommend that you eat foods that have specific amounts of dietary fiber. This information is not intended to replace advice given to you by your health care provider. Make sure you discuss any questions you have with your health care provider. Document Revised: 01/26/2020 Document Reviewed: 01/26/2020 Elsevier Patient Education  2024 [employer].

## 2023-05-01 ENCOUNTER — Other Ambulatory Visit: Payer: Self-pay | Admitting: Family Medicine

## 2023-05-01 DIAGNOSIS — B009 Herpesviral infection, unspecified: Secondary | ICD-10-CM

## 2023-05-05 ENCOUNTER — Ambulatory Visit (INDEPENDENT_AMBULATORY_CARE_PROVIDER_SITE_OTHER): Payer: Medicaid Other | Admitting: Surgery

## 2023-05-05 ENCOUNTER — Ambulatory Visit: Payer: Self-pay | Admitting: Surgery

## 2023-05-05 ENCOUNTER — Encounter: Payer: Self-pay | Admitting: Surgery

## 2023-05-05 VITALS — BP 145/84 | HR 76 | Temp 98.5°F | Ht 66.0 in | Wt 260.6 lb

## 2023-05-05 DIAGNOSIS — K644 Residual hemorrhoidal skin tags: Secondary | ICD-10-CM

## 2023-05-05 DIAGNOSIS — K648 Other hemorrhoids: Secondary | ICD-10-CM | POA: Diagnosis not present

## 2023-05-05 NOTE — Progress Notes (Signed)
Patient ID: Carolyn Rosales, female   DOB: [birth year], 37 y.o.   MRN: [id number]  Chief Complaint: Hemorrhoids  History of Present Illness Since her last visit she has not employed any of the measures recommended to help her medically with her current condition.  She now desires to proceed with surgery. Carolyn Rosales is a 37 y.o. female with a 5-year history of hemorrhoids.  She denies any particular flareup to consist of a recent episode.  She denies any prior history of thrombosis.  She reports her bowel function can vary from skipping days to loose stools, seems to be heavy at the affected by diet including dairy products.  No significant history of fiber intake.  She reports seeing some blood with a bowel movement, typically bright red 2-3 times weekly.  Reports burning only with flareups.  He has utilized sitz bath's primarily to assist with hygiene.  He denies any topical medications, oral laxatives to help manage her bowel.  Family history of colon cancer, no history of colonoscopy.  Past Medical History Past Medical History:  Diagnosis Date   Anxiety    Asthma    as a child   Depression    Gestational diabetes    Herpes genitalis    History of abnormal cervical Pap smear    Lactose intolerance    Motion sickness    ocean boats   Obesity    PTSD (post-traumatic stress disorder)    PTSD (post-traumatic stress disorder)       Past Surgical History:  Procedure Laterality Date   NO PAST SURGERIES      No Known Allergies  Current Outpatient Medications  Medication Sig Dispense Refill   levonorgestrel (MIRENA) 20 MCG/24HR IUD 1 each by Intrauterine route once.     metroNIDAZOLE (FLAGYL) 500 MG tablet Take 1 tablet (500 mg total) by mouth 2 (two) times daily. 14 tablet 0   omeprazole (PRILOSEC) 20 MG capsule Take 20 mg by mouth daily.     sertraline (ZOLOFT) 100 MG tablet TAKE 1 TABLET BY MOUTH DAILY 30 tablet 3   valACYclovir (VALTREX) 500 MG tablet TAKE  1 TABLET(500 MG) BY MOUTH TWICE DAILY FOR 3 DAYS. START WITHIN 24 HOURS OF SYMPTOM ONSET 6 tablet 0   No current facility-administered medications for this visit.    Family History Family History  Problem Relation Age of Onset   Multiple sclerosis Mother    Diabetes Mellitus II Mother    Hypertension Mother    Breast cancer Maternal Aunt    Breast cancer Paternal Aunt    Breast cancer Paternal Grandmother    Diabetes Father       Social History Social History   Tobacco Use   Smoking status: Former    Current packs/day: 0.00    Types: Cigarettes    Quit date: 12/28/2014    Years since quitting: 8.3    Passive exposure: Past   Smokeless tobacco: Never  Vaping Use   Vaping status: Every Day   Substances: THC, CBD  Substance Use Topics   Alcohol use: Yes    Comment: rarely   Drug use: Yes    Types: Marijuana    Comment: History of daily marijuana use prior to pregnancy        Review of Systems  Constitutional: Negative.   HENT: Negative.    Respiratory: Negative.    Cardiovascular: Negative.   Gastrointestinal:  Positive for blood in stool.  Genitourinary: Negative.   Skin: Negative.  Neurological: Negative.   Psychiatric/Behavioral: Negative.       Physical Exam Blood pressure (!) 145/84, pulse 76, temperature 98.5 F (36.9 C), temperature source Oral, height 5\' 6"  (1.676 m), weight 260 lb 9.6 oz (118.2 kg), SpO2 98%. Last Weight  Most recent update: 05/05/2023  3:15 PM    Weight  118.2 kg (260 lb 9.6 oz)             CONSTITUTIONAL: Well developed, and nourished, appropriately responsive and aware without distress.   EYES: Sclera non-icteric.   EARS, NOSE, MOUTH AND THROAT:  The oropharynx is clear. Oral mucosa is pink and moist.   Hearing is intact to voice.  NECK: Trachea is midline, and there is no jugular venous distension.  LYMPH NODES:  Lymph nodes in the neck are not appreciated. RESPIRATORY:  Normal respiratory effort without pathologic use  of accessory muscles. CARDIOVASCULAR:  Well perfused.  GI: The abdomen is soft, nontender, and nondistended.  GU: Carolyn Rosales present as chaperone.  Prominent but not inflamed external hemorrhoidal tags, nearly circumferential.  Prominent internal hemorrhoids without remarkable pile formation, or multiple redundancy to suggest prolapsing internal hemorrhoids.  No bright red blood on exam.  Mild rectocele.  No distal rectal mucosal polyps.  Nontender on exam. MUSCULOSKELETAL:  Symmetrical muscle tone appreciated in all four extremities.    SKIN: Skin turgor is normal. No pathologic skin lesions appreciated.  NEUROLOGIC:  Motor and sensation appear grossly normal.  Cranial nerves are grossly without defect. PSYCH:  Alert and oriented to person, place and time. Affect is appropriate for situation.  Data Reviewed I have personally reviewed what is currently available of the patient's imaging, recent labs and medical records.   Labs:     Latest Ref Rng & Units 08/08/2017    5:33 AM 08/06/2017    4:42 PM 05/28/2017   10:54 AM  CBC  WBC 3.6 - 11.0 K/uL 13.1  13.6  12.6   Hemoglobin 12.0 - 16.0 g/dL 9.6  21.3  08.6   Hematocrit 35.0 - 47.0 % 29.5  32.9  33.4   Platelets 150 - 440 K/uL 216  227  273       Latest Ref Rng & Units 02/16/2023    2:57 PM 08/06/2017    4:42 PM 05/25/2017    1:16 PM  CMP  Glucose 70 - 99 mg/dL 93  80  578   BUN 6 - 20 mg/dL 10  11  8    Creatinine 0.57 - 1.00 mg/dL 4.69  6.29  5.28   Sodium 134 - 144 mmol/L 142  134  136   Potassium 3.5 - 5.2 mmol/L 4.1  3.9  3.7   Chloride 96 - 106 mmol/L 104  106  106   CO2 20 - 29 mmol/L 22  21  21    Calcium 8.7 - 10.2 mg/dL 9.7  9.1  9.1   Total Protein 6.0 - 8.5 g/dL 7.0  6.9  6.6   Total Bilirubin 0.0 - 1.2 mg/dL 0.5  0.5  0.2   Alkaline Phos 44 - 121 IU/L 76  157  80   AST 0 - 40 IU/L 26  19  21    ALT 0 - 32 IU/L 35  14  10       Imaging: Radiological images reviewed:   Within last 24 hrs: No results  found.  Assessment    Internal and external hemorrhoids.  Patient Active Problem List   Diagnosis Date Noted  Internal and external bleeding hemorrhoids 02/26/2023   Lactose intolerance in adult 02/17/2023   Herpes simplex type 2 infection 02/16/2023   Grade III hemorrhoids 03/11/2019   Postpartum depression 08/21/2017   Postpartum care following vaginal delivery 08/21/2017   Urinary incontinence in female 08/11/2017   Breast feeding status of mother 08/11/2017   Indication for care in labor and delivery, antepartum 08/06/2017   Labor and delivery, indication for care 08/06/2017   Gestational diabetes 07/15/2017   BMI 40.0-44.9, adult (HCC) 07/01/2017   Substance abuse affecting pregnancy, antepartum (HCC) 05/13/2017   Herpes genitalis 04/13/2017   History of abnormal cervical Pap smear 04/13/2017   Depression 04/09/2017   Anxiety 04/09/2017   Maternal obesity, antepartum 04/09/2017   Supervision of high risk pregnancy, antepartum 04/09/2017   Marijuana use 04/09/2017    Plan    She would like to proceed with 2 column internal and external hemorrhoidectomy. She has applied none of the medical measures I encouraged her to utilize she has bowel movements that are easy to move 2-3 times a day with associated bleeding and obvious challenges with maintaining hygiene in this area. We discussed that we cannot remove all of the hemorrhoidal complex but will have to be selective.  Obviously her biggest concerns are the rather prominent hemorrhoidal tags. We discussed the risks of general anesthesia, bleeding, recurrence, residual hemorrhoids that will be left, anal sphincteric stenosis or dysfunction, potential diminishment in continence, infection, and potential chronic anal pain.  I believe she understands these risks are not all inclusive and desires to proceed.  No guarantees were ever expressed or implied.  Face-to-face time spent with the patient and accompanying care providers(if  present) was 30 minutes, with more than 50% of the time spent counseling, educating, and coordinating care of the patient.    These notes generated with voice recognition software. I apologize for typographical errors.  Campbell Lerner M.D., FACS 05/05/2023, 4:07 PM

## 2023-05-05 NOTE — H&P (View-Only) (Signed)
 Patient ID: Carolyn Rosales, female   DOB: [birth year], 37 y.o.   MRN: [id number]  Chief Complaint: Hemorrhoids  History of Present Illness Since her last visit she has not employed any of the measures recommended to help her medically with her current condition.  She now desires to proceed with surgery. Carolyn Rosales is a 37 y.o. female with a 5-year history of hemorrhoids.  She denies any particular flareup to consist of a recent episode.  She denies any prior history of thrombosis.  She reports her bowel function can vary from skipping days to loose stools, seems to be heavy at the affected by diet including dairy products.  No significant history of fiber intake.  She reports seeing some blood with a bowel movement, typically bright red 2-3 times weekly.  Reports burning only with flareups.  He has utilized sitz bath's primarily to assist with hygiene.  He denies any topical medications, oral laxatives to help manage her bowel.  Family history of colon cancer, no history of colonoscopy.  Past Medical History Past Medical History:  Diagnosis Date   Anxiety    Asthma    as a child   Depression    Gestational diabetes    Herpes genitalis    History of abnormal cervical Pap smear    Lactose intolerance    Motion sickness    ocean boats   Obesity    PTSD (post-traumatic stress disorder)    PTSD (post-traumatic stress disorder)       Past Surgical History:  Procedure Laterality Date   NO PAST SURGERIES      No Known Allergies  Current Outpatient Medications  Medication Sig Dispense Refill   levonorgestrel (MIRENA) 20 MCG/24HR IUD 1 each by Intrauterine route once.     metroNIDAZOLE (FLAGYL) 500 MG tablet Take 1 tablet (500 mg total) by mouth 2 (two) times daily. 14 tablet 0   omeprazole (PRILOSEC) 20 MG capsule Take 20 mg by mouth daily.     sertraline (ZOLOFT) 100 MG tablet TAKE 1 TABLET BY MOUTH DAILY 30 tablet 3   valACYclovir (VALTREX) 500 MG tablet TAKE  1 TABLET(500 MG) BY MOUTH TWICE DAILY FOR 3 DAYS. START WITHIN 24 HOURS OF SYMPTOM ONSET 6 tablet 0   No current facility-administered medications for this visit.    Family History Family History  Problem Relation Age of Onset   Multiple sclerosis Mother    Diabetes Mellitus II Mother    Hypertension Mother    Breast cancer Maternal Aunt    Breast cancer Paternal Aunt    Breast cancer Paternal Grandmother    Diabetes Father       Social History Social History   Tobacco Use   Smoking status: Former    Current packs/day: 0.00    Types: Cigarettes    Quit date: 12/28/2014    Years since quitting: 8.3    Passive exposure: Past   Smokeless tobacco: Never  Vaping Use   Vaping status: Every Day   Substances: THC, CBD  Substance Use Topics   Alcohol use: Yes    Comment: rarely   Drug use: Yes    Types: Marijuana    Comment: History of daily marijuana use prior to pregnancy        Review of Systems  Constitutional: Negative.   HENT: Negative.    Respiratory: Negative.    Cardiovascular: Negative.   Gastrointestinal:  Positive for blood in stool.  Genitourinary: Negative.   Skin: Negative.  Neurological: Negative.   Psychiatric/Behavioral: Negative.       Physical Exam Blood pressure (!) 145/84, pulse 76, temperature 98.5 F (36.9 C), temperature source Oral, height 5\' 6"  (1.676 m), weight 260 lb 9.6 oz (118.2 kg), SpO2 98%. Last Weight  Most recent update: 05/05/2023  3:15 PM    Weight  118.2 kg (260 lb 9.6 oz)             CONSTITUTIONAL: Well developed, and nourished, appropriately responsive and aware without distress.   EYES: Sclera non-icteric.   EARS, NOSE, MOUTH AND THROAT:  The oropharynx is clear. Oral mucosa is pink and moist.   Hearing is intact to voice.  NECK: Trachea is midline, and there is no jugular venous distension.  LYMPH NODES:  Lymph nodes in the neck are not appreciated. RESPIRATORY:  Normal respiratory effort without pathologic use  of accessory muscles. CARDIOVASCULAR:  Well perfused.  GI: The abdomen is soft, nontender, and nondistended.  GU: [name] present as chaperone.  Prominent but not inflamed external hemorrhoidal tags, nearly circumferential.  Prominent internal hemorrhoids without remarkable pile formation, or multiple redundancy to suggest prolapsing internal hemorrhoids.  No bright red blood on exam.  Mild rectocele.  No distal rectal mucosal polyps.  Nontender on exam. MUSCULOSKELETAL:  Symmetrical muscle tone appreciated in all four extremities.    SKIN: Skin turgor is normal. No pathologic skin lesions appreciated.  NEUROLOGIC:  Motor and sensation appear grossly normal.  Cranial nerves are grossly without defect. PSYCH:  Alert and oriented to person, place and time. Affect is appropriate for situation.  Data Reviewed I have personally reviewed what is currently available of the patient's imaging, recent labs and medical records.   Labs:     Latest Ref Rng & Units 08/08/2017    5:33 AM 08/06/2017    4:42 PM 05/28/2017   10:54 AM  CBC  WBC 3.6 - 11.0 K/uL 13.1  13.6  12.6   Hemoglobin 12.0 - 16.0 g/dL 9.6  21.3  08.6   Hematocrit 35.0 - 47.0 % 29.5  32.9  33.4   Platelets 150 - 440 K/uL 216  227  273       Latest Ref Rng & Units 02/16/2023    2:57 PM 08/06/2017    4:42 PM 05/25/2017    1:16 PM  CMP  Glucose 70 - 99 mg/dL 93  80  578   BUN 6 - 20 mg/dL 10  11  8    Creatinine 0.57 - 1.00 mg/dL 4.69  6.29  5.28   Sodium 134 - 144 mmol/L 142  134  136   Potassium 3.5 - 5.2 mmol/L 4.1  3.9  3.7   Chloride 96 - 106 mmol/L 104  106  106   CO2 20 - 29 mmol/L 22  21  21    Calcium 8.7 - 10.2 mg/dL 9.7  9.1  9.1   Total Protein 6.0 - 8.5 g/dL 7.0  6.9  6.6   Total Bilirubin 0.0 - 1.2 mg/dL 0.5  0.5  0.2   Alkaline Phos 44 - 121 IU/L 76  157  80   AST 0 - 40 IU/L 26  19  21    ALT 0 - 32 IU/L 35  14  10       Imaging: Radiological images reviewed:   Within last 24 hrs: No results  found.  Assessment    Internal and external hemorrhoids.  Patient Active Problem List   Diagnosis Date Noted  Internal and external bleeding hemorrhoids 02/26/2023   Lactose intolerance in adult 02/17/2023   Herpes simplex type 2 infection 02/16/2023   Grade III hemorrhoids 03/11/2019   Postpartum depression 08/21/2017   Postpartum care following vaginal delivery 08/21/2017   Urinary incontinence in female 08/11/2017   Breast feeding status of mother 08/11/2017   Indication for care in labor and delivery, antepartum 08/06/2017   Labor and delivery, indication for care 08/06/2017   Gestational diabetes 07/15/2017   BMI 40.0-44.9, adult (HCC) 07/01/2017   Substance abuse affecting pregnancy, antepartum (HCC) 05/13/2017   Herpes genitalis 04/13/2017   History of abnormal cervical Pap smear 04/13/2017   Depression 04/09/2017   Anxiety 04/09/2017   Maternal obesity, antepartum 04/09/2017   Supervision of high risk pregnancy, antepartum 04/09/2017   Marijuana use 04/09/2017    Plan    She would like to proceed with 2 column internal and external hemorrhoidectomy. She has applied none of the medical measures I encouraged her to utilize she has bowel movements that are easy to move 2-3 times a day with associated bleeding and obvious challenges with maintaining hygiene in this area. We discussed that we cannot remove all of the hemorrhoidal complex but will have to be selective.  Obviously her biggest concerns are the rather prominent hemorrhoidal tags. We discussed the risks of general anesthesia, bleeding, recurrence, residual hemorrhoids that will be left, anal sphincteric stenosis or dysfunction, potential diminishment in continence, infection, and potential chronic anal pain.  I believe she understands these risks are not all inclusive and desires to proceed.  No guarantees were ever expressed or implied.  Face-to-face time spent with the patient and accompanying care providers(if  present) was 30 minutes, with more than 50% of the time spent counseling, educating, and coordinating care of the patient.    These notes generated with voice recognition software. I apologize for typographical errors.  Campbell Lerner M.D., FACS 05/05/2023, 4:07 PM

## 2023-05-05 NOTE — Patient Instructions (Signed)
You may get a fleet enema OTC to use the day before surgery.   Our surgery scheduler Britta Mccreedy will call you within 24-48 hours to get you scheduled. If you have not heard from her after 48 hours, please call our office. Have the blue sheet available when she calls to write down important information.  If you have any concerns or questions, please feel free to call our office.   Surgical Procedures for Hemorrhoids Surgery can be used to treat hemorrhoids. Hemorrhoids are swollen veins in and around the rectum or the opening of the butt (anus). There are two types: Internal. These occur in the veins just inside the rectum. They may poke through to the outside. External. These occur in the veins outside the anus. They can be felt as a swelling or hard lump near the anus. Hemorrhoids can cause bleeding and pain. They can often be treated at home. If home treatments do not help, you may need to have surgery. Types of surgery include: Closed hemorrhoidectomy. Open hemorrhoidectomy. Stapled hemorrhoidectomy. Tell a health care provider about: Any allergies you have. All medicines you are taking, including vitamins, herbs, eye drops, creams, and over-the-counter medicines. Any problems you or family members have had with anesthesia. Any bleeding problems you have. Any surgeries you have had. Any medical conditions you have. Whether you are pregnant or may be pregnant. What are the risks? Your health care provider will talk with you about risks. These may include: Pain and bleeding. You may also have a hematoma. This is a collection of blood in the area where surgery was done. Constipation. You may also have fecal impaction. This is when poop gets trapped in the anal canal. Trouble peeing (urinating) or the inability to control when you pee or poop (incontinence). Stenosis. This is narrowing of the anal canal. Getting hemorrhoids again. A new passage (fistula) forming between the anus or rectum  and somewhere else in the body. Rectal prolapse. This happens when the lining of the rectum slips out of the anus. What happens before the surgery? When to stop eating and drinking Follow instructions from your provider about what you may eat and drink. These may include: 8 hours before your surgery Stop eating most foods. Do not eat meat, fried foods, or fatty foods. Eat only light foods, such as toast or crackers. All liquids are okay except energy drinks and alcohol. 6 hours before your surgery Stop eating. Drink only clear liquids, such as water, clear fruit juice, black coffee, plain tea, and sports drinks. Do not drink energy drinks or alcohol. 2 hours before your surgery Stop drinking all liquids. You may be allowed to take medicines with small sips of water. If you do not follow your provider's instructions, your surgery may be delayed or canceled. Medicines Ask your provider about: Changing or stopping your regular medicines. These include any diabetes medicines or blood thinners you take. Taking medicines such as aspirin and ibuprofen. These medicines can thin your blood. Do not take them unless your provider tells you to. Taking over-the-counter medicines, vitamins, herbs, and supplements. Surgery safety Ask your provider: What steps will be taken to help prevent infection. These steps may include: Washing skin with a soap that kills germs. Taking antibiotics. General instructions You may be told to take a medicine that helps you poop (laxative) and an enema. This is done to clean out your colon before surgery (bowel prep). If you will be going home right after the procedure, plan to have a  responsible adult: Take you home from the hospital or clinic. You will not be allowed to drive. Care for you for the time you are told. What happens during the surgery? An IV will be inserted into one of your veins. You will be given: A sedative. This helps you relax. Anesthesia.  This keeps you from feeling pain. It will make you fall asleep for surgery. A jelly may be put into your rectum. A short tube (anoscope) will be put into your rectum to look at the hemorrhoids. If you have an internal hemorrhoid, you may need a closed hemorrhoidectomy. For this surgery: Tools will be used to open the tissue around the hemorrhoids. The blood supply to the hemorrhoids will be tied off. The hemorrhoids will be taken out. The tissue around the affected area will be closed with stitches (sutures) that your body can absorb. If you have an open hemorrhoidectomy: Tools will be used to remove the hemorrhoids. The incisions will be left open to heal without sutures. If you have hemorrhoids that stick out of your anus, you may need a stapled hemorrhoidectomy. For this surgery: A stapling device will be used to partly remove the hemorrhoids. The device will be put into your anus. It will take out a ring of tissue. This tissue will come from the hemorrhoids and from right above the hemorrhoids. The staples in the device will close the edges of the tissue. This will cut off the blood supply to any bits of the hemorrhoids that are left. It will also pull the tissue back into place. The surgeries may vary among providers and hospitals. What happens after the surgery? Your blood pressure, heart rate, breathing rate, and blood oxygen level may be monitored until you leave the hospital or clinic. You will be given pain medicine as needed. This information is not intended to replace advice given to you by your health care provider. Make sure you discuss any questions you have with your health care provider. Document Revised: 05/16/2022 Document Reviewed: 05/16/2022 Elsevier Patient Education  2024 [employer].

## 2023-05-06 ENCOUNTER — Telehealth: Payer: Self-pay | Admitting: Surgery

## 2023-05-06 NOTE — Telephone Encounter (Signed)
Left message for patient to call, please inform her of the following regarding scheduled surgery with Dr. Claudine Mouton.   Pre-Admission date/time, and Surgery date at [facility].  Surgery Date: 05/22/23 Preadmission Testing Date: 05/14/23 (phone 8a-1p)  Also patient will need to call at [phone], between 1-3:00pm the day before surgery, to find out what time to arrive for surgery.

## 2023-05-07 NOTE — Telephone Encounter (Signed)
Left message again for patient to call me. 

## 2023-05-12 NOTE — Telephone Encounter (Signed)
Left another message for patient to call regarding surgery.

## 2023-05-12 NOTE — Telephone Encounter (Signed)
Patient finally calls back, she is now informed of all days regarding her surgery.

## 2023-05-14 ENCOUNTER — Other Ambulatory Visit: Payer: Self-pay

## 2023-05-14 ENCOUNTER — Encounter: Payer: Self-pay | Admitting: Surgery

## 2023-05-14 ENCOUNTER — Encounter
Admission: RE | Admit: 2023-05-14 | Discharge: 2023-05-14 | Disposition: A | Discharge status: Home / Self Care | Payer: Medicaid Other | Source: Ambulatory Visit | Attending: Surgery | Admitting: Surgery

## 2023-05-14 DIAGNOSIS — Z01812 Encounter for preprocedural laboratory examination: Secondary | ICD-10-CM

## 2023-05-14 HISTORY — DX: Gastro-esophageal reflux disease without esophagitis: K21.9

## 2023-05-14 HISTORY — DX: Dyspnea, unspecified: R06.00

## 2023-05-14 HISTORY — DX: Residual hemorrhoidal skin tags: K64.4

## 2023-05-14 NOTE — Patient Instructions (Addendum)
Your procedure is scheduled on: 05/22/23 - Friday Report to the Registration Desk on the 1st floor of the [facility]. To find out your arrival time, please call [phone] between 1PM - 3PM on: 05/21/23 - Thursday If your arrival time is 6:00 am, do not arrive before that time as the [facility] entrance doors do not open until 6:00 am.  REMEMBER: Instructions that are not followed completely may result in serious medical risk, up to and including death; or upon the discretion of your surgeon and anesthesiologist your surgery may need to be rescheduled.  Do not eat food or drink any liquids after midnight the night before surgery.  No gum chewing or hard candies.   One week prior to surgery: Stop Anti-inflammatories (NSAIDS) such as Advil, Aleve, Ibuprofen, Motrin, Naproxen, Naprosyn and Aspirin based products such as Excedrin, Goody's Powder, BC Powder. You may take Tylenol if needed for pain up until the day of surgery.  Stop ANY OVER THE COUNTER supplements until after surgery.   TAKE ONLY THESE MEDICATIONS THE MORNING OF SURGERY WITH A SIP OF WATER:  omeprazole (PRILOSEC) - (take one the night before and one on the morning of surgery - helps to prevent nausea after surgery.) sertraline (ZOLOFT)   - FLEETS ENEMA - complete as instructed on the night before your surgery and 2 hours before your arrival time on the morning of your surgery.   No Alcohol for 24 hours before or after surgery.  No Smoking including e-cigarettes for 24 hours before surgery.  No chewable tobacco products for at least 6 hours before surgery.  No nicotine patches on the day of surgery.  Do not use any "recreational" drugs for at least a week (preferably 2 weeks) before your surgery.  Please be advised that the combination of cocaine and anesthesia may have negative outcomes, up to and including death. If you test positive for cocaine, your surgery will be cancelled.  On the morning of surgery brush  your teeth with toothpaste and water, you may rinse your mouth with mouthwash if you wish. Do not swallow any toothpaste or mouthwash.  Use CHG Soap or wipes as directed on instruction sheet.  Do not wear jewelry, make-up, hairpins, clips or nail polish.  Do not wear lotions, powders, or perfumes.   Do not shave body hair from the neck down 48 hours before surgery.  Contact lenses, hearing aids and dentures may not be worn into surgery.  Do not bring valuables to the hospital. [facility] is not responsible for any missing/lost belongings or valuables.   Notify your doctor if there is any change in your medical condition (cold, fever, infection).  Wear comfortable clothing (specific to your surgery type) to the hospital.  After surgery, you can help prevent lung complications by doing breathing exercises.  Take deep breaths and cough every 1-2 hours. Your doctor may order a device called an Incentive Spirometer to help you take deep breaths. When coughing or sneezing, hold a pillow firmly against your incision with both hands. This is called "splinting." Doing this helps protect your incision. It also decreases belly discomfort.  If you are being admitted to the hospital overnight, leave your suitcase in the car. After surgery it may be brought to your room.  In case of increased patient census, it may be necessary for you, the patient, to continue your postoperative care in the Same Day Surgery department.  If you are being discharged the day of surgery, you will  not be allowed to drive home. You will need a responsible individual to drive you home and stay with you for 24 hours after surgery.   If you are taking public transportation, you will need to have a responsible individual with you.  Please call the Pre-admissions Testing Dept. at [phone] if you have any questions about these instructions.  Surgery Visitation Policy:  Patients having surgery or a procedure may  have two visitors.  Children under the age of 61 must have an adult with them who is not the patient.  Inpatient Visitation:    Visiting hours are 7 a.m. to 8 p.m. Up to four visitors are allowed at one time in a patient room. The visitors may rotate out with other people during the day.  One visitor age 33 or older may stay with the patient overnight and must be in the room by 8 p.m.

## 2023-05-20 ENCOUNTER — Inpatient Hospital Stay: Admission: RE | Admit: 2023-05-20 | Payer: Medicaid Other | Source: Ambulatory Visit

## 2023-05-21 ENCOUNTER — Telehealth: Payer: Self-pay | Admitting: Surgery

## 2023-05-21 NOTE — Telephone Encounter (Signed)
Patient informed that her preadmission testing date will be in person at the [facility] arriving 8:45 am for 9:00 appt.

## 2023-05-21 NOTE — Telephone Encounter (Signed)
Updated information regarding rescheduled surgery at patient's request.   Patient has been advised of Pre-Admission date/time, and Surgery date at [facility].  Surgery Date: 06/03/23 Preadmission Testing Date: 05/29/23 (phone 8a-1p)  Patient has been made aware to call [phone], between 1-3:00pm the day before surgery, to find out what time to arrive for surgery.

## 2023-05-26 ENCOUNTER — Ambulatory Visit: Payer: Medicaid Other | Admitting: Family Medicine

## 2023-05-29 ENCOUNTER — Inpatient Hospital Stay: Admission: RE | Admit: 2023-05-29 | Payer: Medicaid Other | Source: Ambulatory Visit

## 2023-06-01 ENCOUNTER — Encounter
Admission: RE | Admit: 2023-06-01 | Discharge: 2023-06-01 | Disposition: A | Discharge status: Home / Self Care | Payer: Medicaid Other | Source: Ambulatory Visit | Attending: Surgery | Admitting: Surgery

## 2023-06-01 ENCOUNTER — Ambulatory Visit: Payer: Medicaid Other | Admitting: Family Medicine

## 2023-06-01 DIAGNOSIS — K644 Residual hemorrhoidal skin tags: Secondary | ICD-10-CM | POA: Insufficient documentation

## 2023-06-01 DIAGNOSIS — Z01818 Encounter for other preprocedural examination: Secondary | ICD-10-CM | POA: Diagnosis present

## 2023-06-01 DIAGNOSIS — K648 Other hemorrhoids: Secondary | ICD-10-CM | POA: Diagnosis not present

## 2023-06-01 DIAGNOSIS — Z01812 Encounter for preprocedural laboratory examination: Secondary | ICD-10-CM | POA: Insufficient documentation

## 2023-06-01 DIAGNOSIS — Z8619 Personal history of other infectious and parasitic diseases: Secondary | ICD-10-CM | POA: Insufficient documentation

## 2023-06-01 LAB — CBC WITH DIFFERENTIAL/PLATELET
Abs Immature Granulocytes: 0.05 10*3/uL (ref 0.00–0.07)
Basophils Absolute: 0 10*3/uL (ref 0.0–0.1)
Basophils Relative: 0 %
Eosinophils Absolute: 0.2 10*3/uL (ref 0.0–0.5)
Eosinophils Relative: 2 %
HCT: 41.4 % (ref 36.0–46.0)
Hemoglobin: 13.6 g/dL (ref 12.0–15.0)
Immature Granulocytes: 1 %
Lymphocytes Relative: 18 %
Lymphs Abs: 1.7 10*3/uL (ref 0.7–4.0)
MCH: 29 pg (ref 26.0–34.0)
MCHC: 32.9 g/dL (ref 30.0–36.0)
MCV: 88.3 fL (ref 80.0–100.0)
Monocytes Absolute: 0.4 10*3/uL (ref 0.1–1.0)
Monocytes Relative: 5 %
Neutro Abs: 7.1 10*3/uL (ref 1.7–7.7)
Neutrophils Relative %: 74 %
Platelets: 295 10*3/uL (ref 150–400)
RBC: 4.69 MIL/uL (ref 3.87–5.11)
RDW: 14.4 % (ref 11.5–15.5)
WBC: 9.4 10*3/uL (ref 4.0–10.5)
nRBC: 0 % (ref 0.0–0.2)

## 2023-06-01 LAB — COMPREHENSIVE METABOLIC PANEL
ALT: 28 U/L (ref 0–44)
AST: 19 U/L (ref 15–41)
Albumin: 3.7 g/dL (ref 3.5–5.0)
Alkaline Phosphatase: 61 U/L (ref 38–126)
Anion gap: 7 (ref 5–15)
BUN: 14 mg/dL (ref 6–20)
CO2: 27 mmol/L (ref 22–32)
Calcium: 8.9 mg/dL (ref 8.9–10.3)
Chloride: 103 mmol/L (ref 98–111)
Creatinine, Ser: 0.75 mg/dL (ref 0.44–1.00)
GFR, Estimated: >60 mL/min (ref >60)
Glucose, Bld: 121 mg/dL — ABNORMAL HIGH (ref 70–99)
Potassium: 3.9 mmol/L (ref 3.5–5.1)
Sodium: 137 mmol/L (ref 135–145)
Total Bilirubin: 0.7 mg/dL (ref 0.3–1.2)
Total Protein: 7.3 g/dL (ref 6.5–8.1)

## 2023-06-01 NOTE — Progress Notes (Deleted)
Established patient visit   Patient: Carolyn Rosales   DOB: [birth year]   37 y.o. Female  MRN: [id number] Visit Date: 06/01/2023  Today's healthcare provider: Sherlyn Hay, DO   No chief complaint on file.  Subjective    HPI   ***  {History (Optional):23778}  Medications: Outpatient Medications Prior to Visit  Medication Sig   acetaminophen (TYLENOL) 500 MG tablet Take 1,000 mg by mouth every 6 (six) hours as needed.   levonorgestrel (MIRENA) 20 MCG/24HR IUD 1 each by Intrauterine route once.   metroNIDAZOLE (FLAGYL) 500 MG tablet Take 1 tablet (500 mg total) by mouth 2 (two) times daily.   omeprazole (PRILOSEC) 20 MG capsule Take 20 mg by mouth daily.   sertraline (ZOLOFT) 100 MG tablet TAKE 1 TABLET BY MOUTH DAILY   valACYclovir (VALTREX) 500 MG tablet TAKE 1 TABLET(500 MG) BY MOUTH TWICE DAILY FOR 3 DAYS. START WITHIN 24 HOURS OF SYMPTOM ONSET   No facility-administered medications prior to visit.    Review of Systems  ***  {Insert previous labs (optional):23779} {See past labs  Heme  Chem  Endocrine  Serology  Results Review (optional):1}   Objective    There were no vitals taken for this visit. {Insert last BP/Wt (optional):23777}{See vitals history (optional):1}   Physical Exam  ***  No results found for any visits on 06/01/23.  Assessment & Plan    There are no diagnoses linked to this encounter.   ***  No follow-ups on file.      I discussed the assessment and treatment plan with the patient  The patient was provided an opportunity to ask questions and all were answered. The patient agreed with the plan and demonstrated an understanding of the instructions.   The patient was advised to call back or seek an in-person evaluation if the symptoms worsen or if the condition fails to improve as anticipated.    Sherlyn Hay, DO  [facility] [phone] (phone) [phone] (fax)  [facility]

## 2023-06-02 ENCOUNTER — Encounter: Payer: Self-pay | Admitting: Family Medicine

## 2023-06-02 ENCOUNTER — Other Ambulatory Visit (HOSPITAL_COMMUNITY)
Admission: RE | Admit: 2023-06-02 | Discharge: 2023-06-02 | Disposition: A | Discharge status: Home / Self Care | Payer: Medicaid Other | Source: Ambulatory Visit | Attending: Family Medicine | Admitting: Family Medicine

## 2023-06-02 ENCOUNTER — Ambulatory Visit (INDEPENDENT_AMBULATORY_CARE_PROVIDER_SITE_OTHER): Payer: Medicaid Other | Admitting: Family Medicine

## 2023-06-02 VITALS — BP 124/88 | HR 82 | Ht 66.5 in | Wt 263.3 lb

## 2023-06-02 DIAGNOSIS — N898 Other specified noninflammatory disorders of vagina: Secondary | ICD-10-CM | POA: Insufficient documentation

## 2023-06-02 DIAGNOSIS — Z8619 Personal history of other infectious and parasitic diseases: Secondary | ICD-10-CM

## 2023-06-02 MED ORDER — DOXYCYCLINE HYCLATE 100 MG PO TABS: 100.0000 mg | ORAL_TABLET | Freq: Two times a day (BID) | ORAL | 0 refills | Status: AC

## 2023-06-02 MED ORDER — CHLORHEXIDINE GLUCONATE CLOTH 2 % EX PADS: 6.0000 | MEDICATED_PAD | Freq: Once | CUTANEOUS | Status: DC

## 2023-06-02 MED ORDER — ORAL CARE MOUTH RINSE: 15.0000 mL | Freq: Once | OROMUCOSAL | Status: AC

## 2023-06-02 MED ORDER — ACETAMINOPHEN 500 MG PO TABS
1000.0000 mg | ORAL_TABLET | ORAL | Status: AC
  Administered 2023-06-03: 1000 mg via ORAL

## 2023-06-02 MED ORDER — BUPIVACAINE LIPOSOME 1.3 % IJ SUSP: 20.0000 mL | Freq: Once | INTRAMUSCULAR | Status: DC

## 2023-06-02 MED ORDER — CHLORHEXIDINE GLUCONATE 0.12 % MT SOLN
15.0000 mL | Freq: Once | OROMUCOSAL | Status: AC
  Administered 2023-06-03: 15 mL via OROMUCOSAL

## 2023-06-02 MED ORDER — GABAPENTIN 300 MG PO CAPS
300.0000 mg | ORAL_CAPSULE | ORAL | Status: AC
  Administered 2023-06-03: 300 mg via ORAL

## 2023-06-02 MED ORDER — FLEET ENEMA 7-19 GM/118ML RE ENEM
1.0000 | ENEMA | Freq: Once | RECTAL | Status: DC
  Filled 2023-06-02: qty 1

## 2023-06-02 MED ORDER — CELECOXIB 200 MG PO CAPS
200.0000 mg | ORAL_CAPSULE | ORAL | Status: AC
  Administered 2023-06-03: 200 mg via ORAL

## 2023-06-02 MED ORDER — LACTATED RINGERS IV SOLN: INTRAVENOUS | Status: DC

## 2023-06-02 NOTE — Progress Notes (Signed)
   Established Patient Office Visit  Subjective   Patient ID: Carolyn Rosales, female    DOB: [birth year]  Age: 37 y.o. MRN: [id number]  No chief complaint on file.  Carolyn Rosales is here today because she would like to checked for STIs. She was previously treated for chlamydia in May with amoxicillin. Denies any unusual symptoms with initial infection. May have original had abnormal discharge.  Previously treated for BV with metronidazole. She reports a bump a couple of months ago. It has gone away and came back in more places. She cannot visualize the bumps, just feel them. She was treated with a 3 day course of Valtrex 500 mg BID. The bumps did not go away. She just wants to make sure she is okay because she had a re-exposure and symptoms have reappeared.     Review of Systems  Genitourinary:  Negative for dysuria, frequency and urgency.     Objective:     BP 124/88 (BP Location: Left Arm, Patient Position: Sitting, Cuff Size: Normal)   Pulse 82   Ht 5' 6.5" (1.689 m)   Wt 263 lb 4.8 oz (119.4 kg)   SpO2 100%   BMI 41.86 kg/m   Physical Exam Exam conducted with a chaperone present.  Constitutional:      Appearance: Normal appearance.  Genitourinary:    Exam position: Lithotomy position.     Pubic Area: No rash.      Labia:        Right: No rash, tenderness or lesion.   Neurological:     Mental Status: She is alert.   GYN:  External genitalia within normal limits.  Vaginal mucosa pink, moist, normal rugae.  Nonfriable cervix without lesions, + discharge no bleeding noted on speculum exam.      No results found for any visits on 06/02/23.  The ASCVD Risk score (Arnett DK, et al., 2019) failed to calculate for the following reasons:   The 2019 ASCVD risk score is only valid for ages 55 to 37    Assessment & Plan:   Problem List Items Addressed This Visit       Other   History of chlamydia infection - Primary    Most recently treated with amoxicillin, no  contraindications for preferred treatment methods  Repeat STI testing Ancillary cervical swab Empirically treated with doxycycline       Relevant Orders   Cervicovaginal ancillary only   Vaginal discharge    Recurrent  Previously treated with amoxicillin for chlamydia infection in May 2024  Ancillary cervical swab collected Empiric treatment with doxycycline       Relevant Orders   Cervicovaginal ancillary only    No follow-ups on file.    Rometta Emery, Medical Student  Patient seen along with MS3 student Jodi Marble. I personally evaluated this patient along with the student, and verified all aspects of the history, physical exam, and medical decision making as documented by the student. I agree with the student's documentation and have made all necessary edits.  Izabel Chim, Marzella Schlein, MD, MPH [facility]

## 2023-06-02 NOTE — Assessment & Plan Note (Signed)
Recurrent  Previously treated with amoxicillin for chlamydia infection in May 2024  Ancillary cervical swab collected Empiric treatment with doxycycline

## 2023-06-02 NOTE — Assessment & Plan Note (Addendum)
Most recently treated with amoxicillin, no contraindications for preferred treatment methods  Repeat STI testing Ancillary cervical swab Empirically treated with doxycycline

## 2023-06-03 ENCOUNTER — Ambulatory Visit: Payer: Medicaid Other | Admitting: Urgent Care

## 2023-06-03 ENCOUNTER — Encounter
Admission: RE | Disposition: A | Discharge status: Home / Self Care | Payer: Self-pay | Source: Home / Self Care | Attending: Surgery

## 2023-06-03 ENCOUNTER — Ambulatory Visit
Admission: RE | Admit: 2023-06-03 | Discharge: 2023-06-03 | Disposition: A | Discharge status: Home / Self Care | Payer: Medicaid Other | Attending: Surgery | Admitting: Surgery

## 2023-06-03 ENCOUNTER — Other Ambulatory Visit: Payer: Self-pay

## 2023-06-03 ENCOUNTER — Encounter: Payer: Self-pay | Admitting: Surgery

## 2023-06-03 DIAGNOSIS — F32A Depression, unspecified: Secondary | ICD-10-CM | POA: Insufficient documentation

## 2023-06-03 DIAGNOSIS — K644 Residual hemorrhoidal skin tags: Secondary | ICD-10-CM | POA: Insufficient documentation

## 2023-06-03 DIAGNOSIS — K219 Gastro-esophageal reflux disease without esophagitis: Secondary | ICD-10-CM | POA: Insufficient documentation

## 2023-06-03 DIAGNOSIS — F419 Anxiety disorder, unspecified: Secondary | ICD-10-CM | POA: Insufficient documentation

## 2023-06-03 DIAGNOSIS — Z8616 Personal history of COVID-19: Secondary | ICD-10-CM | POA: Insufficient documentation

## 2023-06-03 DIAGNOSIS — Z800 Family history of malignant neoplasm of digestive organs: Secondary | ICD-10-CM | POA: Insufficient documentation

## 2023-06-03 DIAGNOSIS — Z6841 Body Mass Index (BMI) 40.0 and over, adult: Secondary | ICD-10-CM | POA: Insufficient documentation

## 2023-06-03 DIAGNOSIS — Z01812 Encounter for preprocedural laboratory examination: Secondary | ICD-10-CM

## 2023-06-03 DIAGNOSIS — Z87891 Personal history of nicotine dependence: Secondary | ICD-10-CM | POA: Insufficient documentation

## 2023-06-03 DIAGNOSIS — K641 Second degree hemorrhoids: Secondary | ICD-10-CM | POA: Diagnosis not present

## 2023-06-03 DIAGNOSIS — F4310 Post-traumatic stress disorder, unspecified: Secondary | ICD-10-CM | POA: Diagnosis not present

## 2023-06-03 HISTORY — DX: Other psychoactive substance abuse, uncomplicated: F19.10

## 2023-06-03 HISTORY — DX: Unspecified asthma, uncomplicated: J45.909

## 2023-06-03 HISTORY — PX: HEMORRHOID SURGERY: SHX153

## 2023-06-03 LAB — CERVICOVAGINAL ANCILLARY ONLY
Bacterial Vaginitis (gardnerella): NEGATIVE
Candida Glabrata: NEGATIVE
Candida Vaginitis: POSITIVE — AB
Chlamydia: POSITIVE — AB
Comment: NEGATIVE
Comment: NEGATIVE
Comment: NEGATIVE
Comment: NEGATIVE
Comment: NEGATIVE
Comment: NORMAL
Neisseria Gonorrhea: NEGATIVE
Trichomonas: NEGATIVE

## 2023-06-03 LAB — POCT PREGNANCY, URINE: Preg Test, Ur: NEGATIVE

## 2023-06-03 SURGERY — HEMORRHOIDECTOMY
Anesthesia: General

## 2023-06-03 MED ORDER — GELATIN ABSORBABLE 100 EX MISC
CUTANEOUS | Status: DC | PRN
  Administered 2023-06-03: 1

## 2023-06-03 MED ORDER — PROPOFOL 1000 MG/100ML IV EMUL
INTRAVENOUS | Status: AC
  Filled 2023-06-03: qty 100

## 2023-06-03 MED ORDER — BUPIVACAINE HCL (PF) 0.5 % IJ SOLN
INTRAMUSCULAR | Status: AC
  Filled 2023-06-03: qty 30

## 2023-06-03 MED ORDER — FENTANYL CITRATE (PF) 100 MCG/2ML IJ SOLN
INTRAMUSCULAR | Status: DC | PRN
  Administered 2023-06-03 (×2): 50 ug via INTRAVENOUS

## 2023-06-03 MED ORDER — ONDANSETRON HCL 4 MG/2ML IJ SOLN
INTRAMUSCULAR | Status: DC | PRN
  Administered 2023-06-03: 4 mg via INTRAVENOUS

## 2023-06-03 MED ORDER — 0.9 % SODIUM CHLORIDE (POUR BTL) OPTIME
TOPICAL | Status: DC | PRN
  Administered 2023-06-03: 500 mL

## 2023-06-03 MED ORDER — DROPERIDOL 2.5 MG/ML IJ SOLN: 0.6250 mg | Freq: Once | INTRAMUSCULAR | Status: DC | PRN

## 2023-06-03 MED ORDER — BUPIVACAINE-EPINEPHRINE 0.25% -1:200000 IJ SOLN
INTRAMUSCULAR | Status: DC | PRN
  Administered 2023-06-03: 20 mL

## 2023-06-03 MED ORDER — MIDAZOLAM HCL 2 MG/2ML IJ SOLN
INTRAMUSCULAR | Status: AC
  Filled 2023-06-03: qty 2

## 2023-06-03 MED ORDER — LIDOCAINE HCL (PF) 2 % IJ SOLN
INTRAMUSCULAR | Status: AC
  Filled 2023-06-03: qty 5

## 2023-06-03 MED ORDER — GLYCOPYRROLATE 0.2 MG/ML IJ SOLN
INTRAMUSCULAR | Status: DC | PRN
  Administered 2023-06-03: .2 mg via INTRAVENOUS

## 2023-06-03 MED ORDER — SUCCINYLCHOLINE CHLORIDE 200 MG/10ML IV SOSY
PREFILLED_SYRINGE | INTRAVENOUS | Status: AC
  Filled 2023-06-03: qty 10

## 2023-06-03 MED ORDER — BUPIVACAINE-EPINEPHRINE (PF) 0.25% -1:200000 IJ SOLN
INTRAMUSCULAR | Status: AC
  Filled 2023-06-03: qty 30

## 2023-06-03 MED ORDER — DIBUCAINE (PERIANAL) 1 % EX OINT
TOPICAL_OINTMENT | CUTANEOUS | Status: AC
  Filled 2023-06-03: qty 28

## 2023-06-03 MED ORDER — ACETAMINOPHEN 500 MG PO TABS
ORAL_TABLET | ORAL | Status: AC
  Filled 2023-06-03: qty 2

## 2023-06-03 MED ORDER — DIBUCAINE 1 % EX OINT
TOPICAL_OINTMENT | CUTANEOUS | Status: DC | PRN
  Administered 2023-06-03: 1 via TOPICAL

## 2023-06-03 MED ORDER — GELATIN ABSORBABLE 12-7 MM EX MISC
CUTANEOUS | Status: AC
  Filled 2023-06-03: qty 1

## 2023-06-03 MED ORDER — HYDROCODONE-ACETAMINOPHEN 5-325 MG PO TABS: 1.0000 | ORAL_TABLET | Freq: Four times a day (QID) | ORAL | 0 refills | Status: DC | PRN

## 2023-06-03 MED ORDER — GLYCOPYRROLATE 0.2 MG/ML IJ SOLN
INTRAMUSCULAR | Status: AC
  Filled 2023-06-03: qty 1

## 2023-06-03 MED ORDER — MIDAZOLAM HCL 2 MG/2ML IJ SOLN
INTRAMUSCULAR | Status: DC | PRN
  Administered 2023-06-03: 2 mg via INTRAVENOUS

## 2023-06-03 MED ORDER — LIDOCAINE HCL (CARDIAC) PF 100 MG/5ML IV SOSY
PREFILLED_SYRINGE | INTRAVENOUS | Status: DC | PRN
  Administered 2023-06-03: 100 mg via INTRAVENOUS

## 2023-06-03 MED ORDER — IBUPROFEN 800 MG PO TABS: 800.0000 mg | ORAL_TABLET | Freq: Three times a day (TID) | ORAL | 0 refills | Status: AC | PRN

## 2023-06-03 MED ORDER — DEXAMETHASONE SODIUM PHOSPHATE 10 MG/ML IJ SOLN
INTRAMUSCULAR | Status: DC | PRN
  Administered 2023-06-03: 10 mg via INTRAVENOUS

## 2023-06-03 MED ORDER — ROCURONIUM BROMIDE 100 MG/10ML IV SOLN
INTRAVENOUS | Status: DC | PRN
  Administered 2023-06-03: 50 mg via INTRAVENOUS
  Administered 2023-06-03: 30 mg via INTRAVENOUS

## 2023-06-03 MED ORDER — SUCCINYLCHOLINE CHLORIDE 200 MG/10ML IV SOSY
PREFILLED_SYRINGE | INTRAVENOUS | Status: DC | PRN
  Administered 2023-06-03: 100 mg via INTRAVENOUS

## 2023-06-03 MED ORDER — FENTANYL CITRATE (PF) 100 MCG/2ML IJ SOLN: 25.0000 ug | INTRAMUSCULAR | Status: DC | PRN

## 2023-06-03 MED ORDER — CHLORHEXIDINE GLUCONATE 0.12 % MT SOLN
OROMUCOSAL | Status: AC
  Filled 2023-06-03: qty 15

## 2023-06-03 MED ORDER — GABAPENTIN 300 MG PO CAPS
ORAL_CAPSULE | ORAL | Status: AC
  Filled 2023-06-03: qty 1

## 2023-06-03 MED ORDER — EPINEPHRINE PF 1 MG/ML IJ SOLN
INTRAMUSCULAR | Status: AC
  Filled 2023-06-03: qty 1

## 2023-06-03 MED ORDER — FENTANYL CITRATE (PF) 100 MCG/2ML IJ SOLN
INTRAMUSCULAR | Status: AC
  Filled 2023-06-03: qty 2

## 2023-06-03 MED ORDER — PROPOFOL 10 MG/ML IV BOLUS
INTRAVENOUS | Status: DC | PRN
Start: 2023-06-03 — End: 2023-06-03
  Administered 2023-06-03: 50 ug/kg/min via INTRAVENOUS
  Administered 2023-06-03: 250 mg via INTRAVENOUS

## 2023-06-03 MED ORDER — DEXAMETHASONE SODIUM PHOSPHATE 10 MG/ML IJ SOLN
INTRAMUSCULAR | Status: AC
  Filled 2023-06-03: qty 1

## 2023-06-03 MED ORDER — ONDANSETRON HCL 4 MG/2ML IJ SOLN
INTRAMUSCULAR | Status: AC
  Filled 2023-06-03: qty 2

## 2023-06-03 MED ORDER — BUPIVACAINE LIPOSOME 1.3 % IJ SUSP
INTRAMUSCULAR | Status: AC
  Filled 2023-06-03: qty 20

## 2023-06-03 MED ORDER — CELECOXIB 200 MG PO CAPS
ORAL_CAPSULE | ORAL | Status: AC
  Filled 2023-06-03: qty 1

## 2023-06-03 MED ORDER — SUGAMMADEX SODIUM 200 MG/2ML IV SOLN
INTRAVENOUS | Status: DC | PRN
  Administered 2023-06-03: 200 mg via INTRAVENOUS

## 2023-06-03 SURGICAL SUPPLY — 32 items
BLADE SURG 15 STRL LF DISP TIS (BLADE) ×1 IMPLANT
BLADE SURG 15 STRL SS (BLADE) ×1
BRIEF MESH DISP 2XL (UNDERPADS AND DIAPERS) ×1 IMPLANT
DISSECTOR SURG LIGASURE 21 (MISCELLANEOUS) IMPLANT
DRAPE LAPAROTOMY 100X77 ABD (DRAPES) ×1 IMPLANT
DRSG GAUZE FLUFF 36X18 (GAUZE/BANDAGES/DRESSINGS) ×1 IMPLANT
ELECT CAUTERY BLADE TIP 2.5 (TIP) ×1
ELECT REM PT RETURN 9FT ADLT (ELECTROSURGICAL) ×1
ELECTRODE CAUTERY BLDE TIP 2.5 (TIP) ×1 IMPLANT
ELECTRODE REM PT RTRN 9FT ADLT (ELECTROSURGICAL) ×1 IMPLANT
GAUZE 4X4 16PLY ~~LOC~~+RFID DBL (SPONGE) ×1 IMPLANT
GLOVE ORTHO TXT STRL SZ7.5 (GLOVE) ×1 IMPLANT
GOWN STRL REUS W/ TWL LRG LVL3 (GOWN DISPOSABLE) ×1 IMPLANT
GOWN STRL REUS W/ TWL XL LVL3 (GOWN DISPOSABLE) ×1 IMPLANT
GOWN STRL REUS W/TWL LRG LVL3 (GOWN DISPOSABLE) ×1
GOWN STRL REUS W/TWL XL LVL3 (GOWN DISPOSABLE) ×1
KIT TURNOVER KIT A (KITS) ×1 IMPLANT
MANIFOLD NEPTUNE II (INSTRUMENTS) ×1 IMPLANT
NDL HYPO 22X1.5 SAFETY MO (MISCELLANEOUS) ×1 IMPLANT
NEEDLE HYPO 22X1.5 SAFETY MO (MISCELLANEOUS) ×1 IMPLANT
PACK BASIN MINOR ARMC (MISCELLANEOUS) ×1 IMPLANT
PAD ABD DERMACEA PRESS 5X9 (GAUZE/BANDAGES/DRESSINGS) IMPLANT
SOL PREP PVP 2OZ (MISCELLANEOUS) ×1
SOLUTION PREP PVP 2OZ (MISCELLANEOUS) ×1 IMPLANT
SURGILUBE 2OZ TUBE FLIPTOP (MISCELLANEOUS) ×1 IMPLANT
SUT CHROMIC 3 0 SH 27 (SUTURE) ×1 IMPLANT
SWABSTK COMLB BENZOIN TINCTURE (MISCELLANEOUS) ×1 IMPLANT
SYR 10ML LL (SYRINGE) ×1 IMPLANT
SYR 20ML LL LF (SYRINGE) IMPLANT
TAPE CLOTH 3X10 WHT NS LF (GAUZE/BANDAGES/DRESSINGS) ×2 IMPLANT
TRAP FLUID SMOKE EVACUATOR (MISCELLANEOUS) ×1 IMPLANT
WATER STERILE IRR 500ML POUR (IV SOLUTION) ×1 IMPLANT

## 2023-06-03 NOTE — Discharge Instructions (Addendum)
AMBULATORY SURGERY  DISCHARGE INSTRUCTIONS   The drugs that you were given will stay in your system until tomorrow so for the next 24 hours you should not:  Drive an automobile Make any legal decisions Drink any alcoholic beverage   You may resume regular meals tomorrow.  Today it is better to start with liquids and gradually work up to solid foods.  You may eat anything you prefer, but it is better to start with liquids, then soup and crackers, and gradually work up to solid foods.   Please notify your doctor immediately if you have any unusual bleeding, trouble breathing, redness and pain at the surgery site, drainage, fever, or pain not relieved by medication.     Your post-operative visit with Dr.                                       is: Date:                        Time:    Please call to schedule your post-operative visit.  Additional Instructions: AMBULATORY SURGERY  DISCHARGE INSTRUCTIONS   The drugs that you were given will stay in your system until tomorrow so for the next 24 hours you should not:  Drive an automobile Make any legal decisions Drink any alcoholic beverage   You may resume regular meals tomorrow.  Today it is better to start with liquids and gradually work up to solid foods.  You may eat anything you prefer, but it is better to start with liquids, then soup and crackers, and gradually work up to solid foods.   Please notify your doctor immediately if you have any unusual bleeding, trouble breathing, redness and pain at the surgery site, drainage, fever, or pain not relieved by medication.    Additional Instructions:        Please contact your physician with any problems or Same Day Surgery at [phone], Monday through Friday 6 am to 4 pm, or [place] at [facility] number at [phone].

## 2023-06-03 NOTE — Anesthesia Procedure Notes (Signed)
Procedure Name: Intubation Date/Time: 06/03/2023 3:54 PM  Performed by: Lysbeth Penner, CRNAPre-anesthesia Checklist: Patient identified, Emergency Drugs available, Suction available and Patient being monitored Patient Re-evaluated:Patient Re-evaluated prior to induction Oxygen Delivery Method: Circle system utilized Preoxygenation: Pre-oxygenation with 100% oxygen Induction Type: IV induction Ventilation: Mask ventilation without difficulty Laryngoscope Size: McGraph and 3 Grade View: Grade I Tube type: Oral Tube size: 6.5 mm Number of attempts: 1 Airway Equipment and Method: Stylet and Oral airway Placement Confirmation: ETT inserted through vocal cords under direct vision, positive ETCO2 and breath sounds checked- equal and bilateral Tube secured with: Tape Dental Injury: Teeth and Oropharynx as per pre-operative assessment

## 2023-06-03 NOTE — Anesthesia Preprocedure Evaluation (Signed)
Anesthesia Evaluation  Patient identified by MRN, date of birth, ID band Patient awake    Reviewed: Allergy & Precautions, H&P , NPO status , Patient's Chart, lab work & pertinent test results, reviewed documented beta blocker date and time   History of Anesthesia Complications Negative for: history of anesthetic complications  Airway Mallampati: II  TM Distance: >3 FB Neck ROM: full    Dental  (+) Dental Advidsory Given, Chipped, Teeth Intact   Pulmonary shortness of breath and with exertion, asthma , Continuous Positive Airway Pressure Ventilation neg sleep apnea, neg COPD, neg recent URI, former smoker   Pulmonary exam normal breath sounds clear to auscultation       Cardiovascular Exercise Tolerance: Good negative cardio ROS Normal cardiovascular exam Rhythm:regular Rate:Normal     Neuro/Psych  PSYCHIATRIC DISORDERS Anxiety Depression    negative neurological ROS     GI/Hepatic Neg liver ROS,GERD  ,,  Endo/Other  neg diabetes  Morbid obesity  Renal/GU negative Renal ROS  negative genitourinary   Musculoskeletal   Abdominal   Peds  Hematology negative hematology ROS (+)   Anesthesia Other Findings Past Medical History: No date: Anxiety No date: Childhood asthma 02/16/2023: Chlamydia No date: Depression No date: Dyspnea No date: GERD (gastroesophageal reflux disease) No date: Gestational diabetes No date: Herpes genitalis 10/20/2022: History of 2019 novel coronavirus disease (COVID-19) No date: History of abnormal cervical Pap smear 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 No date: Internal and external bleeding hemorrhoids No date: Lactose intolerance No date: Motion sickness     Comment:  ocean boats No date: Obesity No date: Polysubstance abuse (HCC)     Comment:  a.) THC + cocaine 08/21/2017: Postpartum depression No date: PTSD (post-traumatic stress disorder)    Reproductive/Obstetrics negative OB ROS                             Anesthesia Physical Anesthesia Plan  ASA: 3  Anesthesia Plan: General   Post-op Pain Management:    Induction: Intravenous  PONV Risk Score and Plan: 3 and Ondansetron, Dexamethasone, Midazolam and Treatment may vary due to age or medical condition  Airway Management Planned: Oral ETT  Additional Equipment:   Intra-op Plan:   Post-operative Plan: Extubation in OR  Informed Consent: I have reviewed the patients History and Physical, chart, labs and discussed the procedure including the risks, benefits and alternatives for the proposed anesthesia with the patient or authorized representative who has indicated his/her understanding and acceptance.     Dental Advisory Given  Plan Discussed with: Anesthesiologist, CRNA and Surgeon  Anesthesia Plan Comments:        Anesthesia Quick Evaluation

## 2023-06-03 NOTE — Op Note (Signed)
SURGICAL OPERATIVE REPORT   DATE OF PROCEDURE: 06/03/2023  ATTENDING Surgeon(s): Campbell Lerner, MD  ANESTHESIA: GETA  PRE-OPERATIVE DIAGNOSIS: Symptomatic (painful)/Bleeding 2nd degree internal and external hemorrhoids with symptoms not well-controlled despite medical management   POST-OPERATIVE DIAGNOSIS: Same  PROCEDURE(S):  1.) Anoscopy 2.) Extensive hemorrhectomy (Left posterior & anterior internal/external, Right posterior internal/external hemorrhoids) and excision of skin tag(s) x3 (cpt: 46260)  INTRAOPERATIVE FINDINGS: 2nd degree internal hemorrhoids and external hemorrhoids with skin tags  ESTIMATED BLOOD LOSS: 20 mL  URINE OUTPUT: No foley   SPECIMENS: Sent to path.   COMPLICATIONS: None apparent   CONDITION AT END OF PROCEDURE: Hemodynamically stable and extubated   DISPOSITION OF PATIENT: PACU  INDICATIONS FOR PROCEDURE:  Patient is a 37 y.o. female who presented with symptomatic (painful)/bleeding 2nd degree internal and external hemorrhoids not well-controlled with medical management.  All risks, benefits, and alternatives to above procedure were discussed with the patient, all of patient's questions were answered to her expressed satisfaction, and informed consent was obtained and documented.   DETAILS OF PROCEDURE: Patient was brought to the operative suite and appropriately identified. General  anesthesia was administered, and endotracheal intubation was performed by anesthetist.  Repositioned to prone/jackknife and buttocks taped for exposure, operative site was prepped and draped in the usual sterile fashion, and following a brief time out, rigid anoscopy was performed with findings of no distal rectal masses, normal mucosa,  prominent areas involving  anterior and posterior right and posterior left quadrants. Local anesthetic of 20 mL Exparel with quarter percent Marcaine  with epinephrine (totalling 50 mL) was injected, and  three hemorrhoidal pedicles were  identified. An Alice clamp was placed near the base of each pedicle near the dentate line and retracted externally to exteriorize the hemorrhoidal pedicle. Retracted hemorrhoid was carefully dissected from underlying/adherent tissues, and the Ligasure was advanced across the base of the hemorrhoidal pedicle, and the above was repeated for all 3 internal/external hemorrhoids. The underlying internal sphincteric muscle fibers were preserved.  Hemostasis was achieved/confirmed. A Surgifoam is saturated with Nupercainal ointment was inserted into the anal canal for additional hemostasis and pain control. Fluffs, ABD, and mesh briefs were applied.  Patient was then safely able to be extubated, awakened, and transferred to PACU for post-operative monitoring and care.  Campbell Lerner, M.D., [facility]  06/03/2023 ; 4:39 PM

## 2023-06-03 NOTE — Transfer of Care (Signed)
Immediate Anesthesia Transfer of Care Note  Patient: Carolyn Rosales  Procedure(s) Performed: HEMORRHOIDECTOMY, 2 plus columns  Patient Location: PACU  Anesthesia Type:General  Level of Consciousness: awake and oriented  Airway & Oxygen Therapy: Patient Spontanous Breathing  Post-op Assessment: Report given to RN and Post -op Vital signs reviewed and stable  Post vital signs: Reviewed and stable  Last Vitals:  Vitals Value Taken Time  BP 110/79 06/03/23 1646  Temp    Pulse 98 06/03/23 1650  Resp 16 06/03/23 1650  SpO2 98 % 06/03/23 1650  Vitals shown include unfiled device data.  Last Pain:  Vitals:   06/03/23 1352  TempSrc: Oral         Complications: There were no known notable events for this encounter.

## 2023-06-03 NOTE — Interval H&P Note (Signed)
History and Physical Interval Note:  06/03/2023 3:03 PM  Carolyn Rosales  has presented today for surgery, with the diagnosis of internal and external hemorrhoids.  The various methods of treatment have been discussed with the patient and family. After consideration of risks, benefits and other options for treatment, the patient has consented to  Procedure(s): HEMORRHOIDECTOMY, 2 plus columns (N/A) as a surgical intervention.  The patient's history has been reviewed, patient examined, no change in status, stable for surgery.  I have reviewed the patient's chart and labs.  Questions were answered to the patient's satisfaction.     Campbell Lerner

## 2023-06-04 ENCOUNTER — Encounter: Payer: Self-pay | Admitting: Surgery

## 2023-06-04 ENCOUNTER — Encounter: Payer: Self-pay | Admitting: Family Medicine

## 2023-06-04 MED ORDER — FLUCONAZOLE 150 MG PO TABS: 150.0000 mg | ORAL_TABLET | Freq: Once | ORAL | 0 refills | Status: AC

## 2023-06-05 NOTE — Anesthesia Postprocedure Evaluation (Signed)
Anesthesia Post Note  Patient: Carolyn Rosales  Procedure(s) Performed: HEMORRHOIDECTOMY, 2 plus columns  Patient location during evaluation: PACU Anesthesia Type: General Level of consciousness: awake and alert Pain management: pain level controlled Vital Signs Assessment: post-procedure vital signs reviewed and stable Respiratory status: spontaneous breathing, nonlabored ventilation, respiratory function stable and patient connected to nasal cannula oxygen Cardiovascular status: blood pressure returned to baseline and stable Postop Assessment: no apparent nausea or vomiting Anesthetic complications: no   There were no known notable events for this encounter.   Last Vitals:  Vitals:   06/03/23 1730 06/03/23 1745  BP: 122/74 (!) 131/91  Pulse: 79 77  Resp: (!) 7 20  Temp: (!) 36.2 C (!) 36.1 C  SpO2: 94% 100%    Last Pain:  Vitals:   06/04/23 0908  TempSrc:   PainSc: 0-No pain                 [name]

## 2023-06-15 ENCOUNTER — Ambulatory Visit: Payer: Medicaid Other | Admitting: Family Medicine

## 2023-06-25 ENCOUNTER — Ambulatory Visit (INDEPENDENT_AMBULATORY_CARE_PROVIDER_SITE_OTHER): Payer: Medicaid Other | Admitting: Surgery

## 2023-06-25 ENCOUNTER — Encounter: Payer: Self-pay | Admitting: Surgery

## 2023-06-25 VITALS — BP 129/80 | HR 65 | Temp 98.3°F | Ht 67.0 in | Wt 263.0 lb

## 2023-06-25 DIAGNOSIS — Z8719 Personal history of other diseases of the digestive system: Secondary | ICD-10-CM | POA: Insufficient documentation

## 2023-06-25 DIAGNOSIS — K644 Residual hemorrhoidal skin tags: Secondary | ICD-10-CM

## 2023-06-25 DIAGNOSIS — K641 Second degree hemorrhoids: Secondary | ICD-10-CM

## 2023-06-25 DIAGNOSIS — Z09 Encounter for follow-up examination after completed treatment for conditions other than malignant neoplasm: Secondary | ICD-10-CM

## 2023-06-25 NOTE — Progress Notes (Signed)
[facility] POST-OP OFFICE VISIT  06/25/2023  HPI: Carolyn Rosales is a 37 y.o. female 21 days s/p hemorrhoidectomy.  Having some discomfort, deferred utilizing the Metamucil for the first week postop.  Minimal bleeding, discomfort with movement but limited to that duration.  Vital signs: There were no vitals taken for this visit.   Physical Exam: Constitutional: Appears well Abdomen: Benign Skin: Perianal skin is clean dry and intact. DRE: Carolyn Rosales present as chaperone.  Some residual external tags as anticipated.  Within the anal canal there are still some healing areas.  No internal exam attempted.  No evidence of ecchymosis, induration or fluctuance.  Assessment/Plan: This is a 37 y.o. female 21 days s/p hemorrhoidectomy, progressing as anticipated.  Patient Active Problem List   Diagnosis Date Noted   Vaginal discharge 06/02/2023   History of chlamydia infection 06/01/2023   Internal and external bleeding hemorrhoids 02/26/2023   Lactose intolerance in adult 02/17/2023   Grade III hemorrhoids 03/11/2019   Urinary incontinence in female 08/11/2017   BMI 40.0-44.9, adult (HCC) 07/01/2017   Herpes genitalis 04/13/2017   History of abnormal cervical Pap smear 04/13/2017   Depression 04/09/2017   Anxiety 04/09/2017   Marijuana use 04/09/2017    -Advised to continue to supplement her diet with fiber as previously discussed.  She has resumed her Metamucil, and efforts to maintain a regular habit of bowel activity discussed again.  Will follow-up in 2 months or as needed.   Campbell Lerner M.D., FACS 06/25/2023, 12:59 PM

## 2023-06-25 NOTE — Patient Instructions (Addendum)
Continue to do sitz baths after every bowel movement. Continue with the Miralax. Do add Metamucil to your daily regimen.  Follow up here in 2 weeks.   Surgical Procedures for Hemorrhoids, Care After After surgery for hemorrhoids, it is common to have: Pain in your rectum for 2-4 weeks after the procedure, and may take 1-2 months to fully recover. Pain when you are pooping. A small amount of bleeding from your rectum. This is more likely to happen the first time you poop after surgery. Follow these instructions at home: Medicines Take over-the-counter and prescription medicines only as told by your health care provider. If you were prescribed antibiotics, use them as told by your provider. Do not stop using the antibiotic even if you start to feel better. Ask your provider if the medicine prescribed to you requires you to avoid driving or using machinery. Use a stool softener or a medicine that helps you poop (laxative) as told by your provider. Eating and drinking Follow instructions from your provider about what you may eat and drink after the surgery. You may need to take these actions to prevent or treat constipation: Drink enough fluid to keep your pee (urine) pale yellow. Take over-the-counter or prescription medicines. Eat foods that are high in fiber, such as beans, whole grains, and fresh fruits and vegetables. Limit foods that are high in fat and processed sugars, such as fried or sweet foods. Managing pain and swelling  Take warm sitz baths for 15-20 minutes, 2-3 times a day to ease soreness and itching. This will also help keep the rectal area clean. If told, put ice on the affected area. It may help to use ice packs between sitz baths. Put ice in a plastic bag. Place a towel between your skin and the bag. Leave the ice on for 20 minutes, 2-3 times a day. If your skin turns bright red, remove the ice right away to prevent skin damage. The risk of damage is higher if you  cannot feel pain, heat, or cold. Activity Rest as told by your provider. Do not sit for a long time without moving. Get up to take short walks every 1-2 hours. This will improve blood flow and breathing. Ask for help if you feel weak or unsteady. You may have to avoid lifting. Ask your provider how much you can safely lift. Return to your normal activities as told by your provider. Ask your provider what activities are safe for you. General instructions Do not strain to poop. Do not spend a long time sitting on the toilet. If you were given a sedative during the surgery, it can affect you for several hours. Do not drive or operate machinery until your provider says that it is safe. Your provider may give you more instructions. Make sure you know what you can and cannot do. Contact a health care provider if: Your pain medicine is not helping. You have a fever or chills. You have drainage that smells bad. You have a lot of swelling. You become constipated. You have trouble peeing (urinating). You have very bad pain in your rectum. Get help right away if: You are bleeding a lot from your rectum. This information is not intended to replace advice given to you by your health care provider. Make sure you discuss any questions you have with your health care provider. Document Revised: 05/16/2022 Document Reviewed: 05/16/2022 Elsevier Patient Education  2024 [employer].

## 2023-07-09 ENCOUNTER — Encounter: Payer: Medicaid Other | Admitting: Surgery

## 2023-07-13 NOTE — Progress Notes (Unsigned)
[facility] POST-OP OFFICE VISIT  07/14/2023  HPI: Carolyn Rosales is a 37 y.o. female s/p hemorrhoidectomy.  Had been doing well until just today when she had a little bit of bleeding with her last bowel movement.  She reports the pain is fine. Having deferred utilizing the Metamucil still, minimizing any unnecessary expenses at the present time.  Vital signs: BP 137/83   Pulse 69   Temp 98 F (36.7 C)   Ht 5\' 7"  (1.702 m)   Wt 263 lb (119.3 kg)   SpO2 98%   BMI 41.19 kg/m    Physical Exam: Constitutional: Appears well Abdomen: Benign Skin: Perianal skin is clean dry and intact. DRE: Carolyn Rosales present as chaperone.  Some residual external tags as anticipated, little more edematous today.  Within the anal canal there are still some healing areas.  No internal exam attempted.  No evidence of ecchymosis, induration or fluctuance.    Assessment/Plan: This is a 37 y.o. female 6 weeks... s/p hemorrhoidectomy, progressing as anticipated.  Patient Active Problem List   Diagnosis Date Noted   Status post hemorrhoidectomy 06/25/2023   Vaginal discharge 06/02/2023   History of chlamydia infection 06/01/2023   Lactose intolerance in adult 02/17/2023   Urinary incontinence in female 08/11/2017   BMI 40.0-44.9, adult (HCC) 07/01/2017   Herpes genitalis 04/13/2017   History of abnormal cervical Pap smear 04/13/2017   Depression 04/09/2017   Anxiety 04/09/2017   Marijuana use 04/09/2017    -Advised to continue to supplement her diet with fiber as previously discussed.  She has resumed her MiraLAX, and efforts to maintain a regular habit of bowel activity discussed again.  Will follow-up in 2 months or as needed.   Campbell Lerner M.D., FACS 07/14/2023, 3:18 PM

## 2023-07-14 ENCOUNTER — Ambulatory Visit (INDEPENDENT_AMBULATORY_CARE_PROVIDER_SITE_OTHER): Payer: Medicaid Other | Admitting: Surgery

## 2023-07-14 VITALS — BP 137/83 | HR 69 | Temp 98.0°F | Ht 67.0 in | Wt 263.0 lb

## 2023-07-14 DIAGNOSIS — Z09 Encounter for follow-up examination after completed treatment for conditions other than malignant neoplasm: Secondary | ICD-10-CM

## 2023-07-14 DIAGNOSIS — Z8719 Personal history of other diseases of the digestive system: Secondary | ICD-10-CM

## 2023-07-14 DIAGNOSIS — K641 Second degree hemorrhoids: Secondary | ICD-10-CM

## 2023-07-14 DIAGNOSIS — K644 Residual hemorrhoidal skin tags: Secondary | ICD-10-CM

## 2023-07-14 NOTE — Patient Instructions (Signed)
Try taking Metamucil(psyllium fiber) 1-2 times a day with plenty of water. Generic is fine.    Follow up here in 2 months.      Please call and ask to speak with a nurse if you develop questions or concerns.

## 2023-09-01 ENCOUNTER — Ambulatory Visit: Payer: Medicaid Other | Admitting: Surgery

## 2023-09-01 NOTE — Progress Notes (Deleted)
[facility] POST-OP OFFICE VISIT  09/01/2023  HPI: Carolyn Rosales is a 37 y.o. female s/p hemorrhoidectomy.   *** Had been doing well until just today when she had a little bit of bleeding with her last bowel movement.  She reports the pain is fine. Having deferred utilizing the Metamucil still, minimizing any unnecessary expenses at the present time.  Vital signs: There were no vitals taken for this visit.   Physical Exam: Constitutional: Appears well Abdomen: Benign Skin: Perianal skin is clean dry and intact. DRE: Carolyn Rosales *** present as chaperone.  Some residual external tags as anticipated, little more edematous today.  Within the anal canal there are still some healing areas.  No *** internal exam attempted.  No evidence of ecchymosis, induration or fluctuance.    Assessment/Plan: This is a 37 y.o. female 6 weeks... s/p hemorrhoidectomy, progressing as anticipated.  Patient Active Problem List   Diagnosis Date Noted   Status post hemorrhoidectomy 06/25/2023   Vaginal discharge 06/02/2023   History of chlamydia infection 06/01/2023   Lactose intolerance in adult 02/17/2023   Urinary incontinence in female 08/11/2017   BMI 40.0-44.9, adult (HCC) 07/01/2017   Herpes genitalis 04/13/2017   History of abnormal cervical Pap smear 04/13/2017   Depression 04/09/2017   Anxiety 04/09/2017   Marijuana use 04/09/2017    -Advised to continue to supplement her diet with fiber as previously discussed.  She has resumed her MiraLAX, and efforts to maintain a regular habit of bowel activity discussed again.  Will follow-up in 2 months or as needed.   Campbell Lerner M.D., FACS 09/01/2023, 2:26 PM

## 2023-09-17 ENCOUNTER — Ambulatory Visit: Payer: Medicaid Other | Admitting: Surgery

## 2023-09-17 ENCOUNTER — Encounter: Payer: Self-pay | Admitting: Surgery

## 2023-09-17 VITALS — BP 128/81 | HR 80 | Temp 98.3°F | Ht 67.0 in | Wt 263.6 lb

## 2023-09-17 DIAGNOSIS — K641 Second degree hemorrhoids: Secondary | ICD-10-CM

## 2023-09-17 DIAGNOSIS — Z09 Encounter for follow-up examination after completed treatment for conditions other than malignant neoplasm: Secondary | ICD-10-CM

## 2023-09-17 DIAGNOSIS — Z8719 Personal history of other diseases of the digestive system: Secondary | ICD-10-CM

## 2023-09-17 NOTE — Patient Instructions (Signed)
Surgical Procedures for Hemorrhoids, Care After After surgery for hemorrhoids, it is common to have: Pain in your rectum for 2-4 weeks after the procedure, and may take 1-2 months to fully recover. Pain when you are pooping. A small amount of bleeding from your rectum. This is more likely to happen the first time you poop after surgery. Follow these instructions at home: Medicines Take over-the-counter and prescription medicines only as told by your health care provider. If you were prescribed antibiotics, use them as told by your provider. Do not stop using the antibiotic even if you start to feel better. Ask your provider if the medicine prescribed to you requires you to avoid driving or using machinery. Use a stool softener or a medicine that helps you poop (laxative) as told by your provider. Eating and drinking Follow instructions from your provider about what you may eat and drink after the surgery. You may need to take these actions to prevent or treat constipation: Drink enough fluid to keep your pee (urine) pale yellow. Take over-the-counter or prescription medicines. Eat foods that are high in fiber, such as beans, whole grains, and fresh fruits and vegetables. Limit foods that are high in fat and processed sugars, such as fried or sweet foods. Managing pain and swelling  Take warm sitz baths for 15-20 minutes, 2-3 times a day to ease soreness and itching. This will also help keep the rectal area clean. If told, put ice on the affected area. It may help to use ice packs between sitz baths. Put ice in a plastic bag. Place a towel between your skin and the bag. Leave the ice on for 20 minutes, 2-3 times a day. If your skin turns bright red, remove the ice right away to prevent skin damage. The risk of damage is higher if you cannot feel pain, heat, or cold. Activity Rest as told by your provider. Do not sit for a long time without moving. Get up to take short walks every 1-2  hours. This will improve blood flow and breathing. Ask for help if you feel weak or unsteady. You may have to avoid lifting. Ask your provider how much you can safely lift. Return to your normal activities as told by your provider. Ask your provider what activities are safe for you. General instructions Do not strain to poop. Do not spend a long time sitting on the toilet. If you were given a sedative during the surgery, it can affect you for several hours. Do not drive or operate machinery until your provider says that it is safe. Your provider may give you more instructions. Make sure you know what you can and cannot do. Contact a health care provider if: Your pain medicine is not helping. You have a fever or chills. You have drainage that smells bad. You have a lot of swelling. You become constipated. You have trouble peeing (urinating). You have very bad pain in your rectum. Get help right away if: You are bleeding a lot from your rectum. This information is not intended to replace advice given to you by your health care provider. Make sure you discuss any questions you have with your health care provider. Document Revised: 05/16/2022 Document Reviewed: 05/16/2022 Elsevier Patient Education  2024 [employer].

## 2023-09-17 NOTE — Progress Notes (Signed)
[facility] POST-OP OFFICE VISIT  09/17/2023  HPI: Carolyn Rosales is a 37 y.o. female s/p hemorrhoidectomy.   She reports she is doing well, has no issues, no pain no bleeding no itching.  Has no interest in any additional perianal/hemorrhoidal procedures.  Vital signs: BP 128/81   Pulse 80   Temp 98.3 F (36.8 C) (Oral)   Ht 5\' 7"  (1.702 m)   Wt 263 lb 9.6 oz (119.6 kg)   SpO2 96%   BMI 41.29 kg/m    Physical Exam: Constitutional: Appears well  DRE: Deferred.  Assessment/Plan: This is a 37 y.o. female 6 weeks... s/p hemorrhoidectomy, progressing as anticipated.  Patient Active Problem List   Diagnosis Date Noted   Status post hemorrhoidectomy 06/25/2023   Vaginal discharge 06/02/2023   History of chlamydia infection 06/01/2023   Lactose intolerance in adult 02/17/2023   Urinary incontinence in female 08/11/2017   BMI 40.0-44.9, adult (HCC) 07/01/2017   Herpes genitalis 04/13/2017   History of abnormal cervical Pap smear 04/13/2017   Depression 04/09/2017   Anxiety 04/09/2017   Marijuana use 04/09/2017    -Advised to continue to supplement her diet with fiber as previously discussed.  She has resumed her MiraLAX, and efforts to maintain a regular habit of bowel activity discussed again.  Will follow-up as needed.   Campbell Lerner M.D., FACS 09/17/2023, 3:00 PM

## 2023-11-29 ENCOUNTER — Other Ambulatory Visit: Payer: Self-pay | Admitting: Family Medicine

## 2023-11-29 DIAGNOSIS — B009 Herpesviral infection, unspecified: Secondary | ICD-10-CM

## 2024-01-15 ENCOUNTER — Encounter: Payer: Self-pay | Admitting: Family Medicine

## 2024-03-09 ENCOUNTER — Ambulatory Visit (INDEPENDENT_AMBULATORY_CARE_PROVIDER_SITE_OTHER): Admitting: Nurse Practitioner

## 2024-03-09 ENCOUNTER — Encounter: Payer: Self-pay | Admitting: Nurse Practitioner

## 2024-03-09 VITALS — BP 124/72 | HR 83 | Temp 98.3°F | Ht 67.0 in | Wt 270.5 lb

## 2024-03-09 DIAGNOSIS — Z7689 Persons encountering health services in other specified circumstances: Secondary | ICD-10-CM

## 2024-03-09 DIAGNOSIS — E782 Mixed hyperlipidemia: Secondary | ICD-10-CM | POA: Insufficient documentation

## 2024-03-09 DIAGNOSIS — Z131 Encounter for screening for diabetes mellitus: Secondary | ICD-10-CM

## 2024-03-09 DIAGNOSIS — F419 Anxiety disorder, unspecified: Secondary | ICD-10-CM | POA: Diagnosis not present

## 2024-03-09 DIAGNOSIS — Z6841 Body Mass Index (BMI) 40.0 and over, adult: Secondary | ICD-10-CM

## 2024-03-09 DIAGNOSIS — F330 Major depressive disorder, recurrent, mild: Secondary | ICD-10-CM

## 2024-03-09 DIAGNOSIS — G5603 Carpal tunnel syndrome, bilateral upper limbs: Secondary | ICD-10-CM

## 2024-03-09 DIAGNOSIS — Z130 Encounter for screening for diseases of the blood and blood-forming organs and certain disorders involving the immune mechanism: Secondary | ICD-10-CM

## 2024-03-09 DIAGNOSIS — K219 Gastro-esophageal reflux disease without esophagitis: Secondary | ICD-10-CM | POA: Insufficient documentation

## 2024-03-09 MED ORDER — SERTRALINE HCL 100 MG PO TABS
100.0000 mg | ORAL_TABLET | Freq: Every day | ORAL | 1 refills | Status: AC
Start: 2024-03-09 — End: ?

## 2024-03-09 NOTE — Progress Notes (Signed)
 BP 124/72 (BP Location: Left Arm, Patient Position: Sitting, Cuff Size: Normal)   Pulse 83   Temp 98.3 F (36.8 C) (Oral)   Ht 5\' 7"  (1.702 m)   Wt 270 lb 8 oz (122.7 kg)   SpO2 99%   BMI 42.37 kg/m    Subjective:    Patient ID: Carolyn Rosales, female    DOB: [birth year], 38 y.o.   MRN: [id number]  HPI: Carolyn Rosales is a 38 y.o. female  Chief Complaint  Patient presents with   Establish Care   Depression    Currently taking zoloft , feels as if she is doing well on this and wants to continue   Wrist Pain    Bilateral, onset 3-4 months, wears hand braces at night time and seems to help. Pt thinks it may be carpal tunnel     Discussed the use of AI scribe software for clinical note transcription with the patient, who gave verbal consent to proceed.  History of Present Illness Carolyn Rosales is a 38 year old female who presents to establish care.   She has been experiencing severe bilateral wrist pain for the past three to four months, particularly at night. The pain is located in her wrists and sometimes extends to her thumbs. Wearing braces at night provides some relief. She has not consulted [facility] orthopedic specialist for this issue.  She has a history of depression and anxiety, managed with Zoloft  100 mg daily for approximately eight years. She previously took 200 mg daily but found it excessive. Her current anxiety and depression screenings are elevated, with scores of 10 and 14, respectively.  She has a history of obesity, with a current weight of 270 pounds and a BMI of 42.37. She is actively working three jobs, including as a Conservation officer, nature, in Therapist, music care, and doing [employer], which involves frequent use of her hands.  She takes omeprazole  over the counter for acid reflux, which she uses as a reminder to take her antidepressants, as she takes both medications once a day.       03/09/2024    2:29 PM 06/02/2023    4:05 PM 11/11/2017    2:24 PM   GAD 7 : Generalized Anxiety Score  Nervous, Anxious, on Edge 1 1 2   Control/stop worrying 1 0 3  Worry too much - different things 1 1 3   Trouble relaxing 1 0 2  Restless 2 0 0  Easily annoyed or irritable 3 0 2  Afraid - awful might happen 1 0 2  Total GAD 7 Score 10 2 14   Anxiety Difficulty  Not difficult at all Very difficult         03/09/2024    2:29 PM 06/02/2023    4:05 PM 02/16/2023    1:36 PM  Depression screen PHQ 2/9  Decreased Interest 2 0 0  Down, Depressed, Hopeless 1 0 0  PHQ - 2 Score 3 0 0  Altered sleeping 3  0  Tired, decreased energy 2  1  Change in appetite 3  0  Feeling bad or failure about yourself  1  0  Trouble concentrating 0  0  Moving slowly or fidgety/restless 2  0  Suicidal thoughts 0  0  PHQ-9 Score 14  1  Difficult doing work/chores   Not difficult at all    Relevant past medical, surgical, family and social history reviewed and updated as indicated. Interim medical history since our last visit reviewed.  Allergies and medications reviewed and updated.  Review of Systems  Ten systems reviewed and is negative except as mentioned in HPI      Objective:      BP 124/72 (BP Location: Left Arm, Patient Position: Sitting, Cuff Size: Normal)   Pulse 83   Temp 98.3 F (36.8 C) (Oral)   Ht 5\' 7"  (1.702 m)   Wt 270 lb 8 oz (122.7 kg)   SpO2 99%   BMI 42.37 kg/m    Wt Readings from Last 3 Encounters:  03/09/24 270 lb 8 oz (122.7 kg)  09/17/23 263 lb 9.6 oz (119.6 kg)  07/14/23 263 lb (119.3 kg)    Physical Exam Vitals reviewed.  Constitutional:      Appearance: Normal appearance.  HENT:     Head: Normocephalic.  Cardiovascular:     Rate and Rhythm: Normal rate and regular rhythm.  Pulmonary:     Effort: Pulmonary effort is normal.     Breath sounds: Normal breath sounds.  Musculoskeletal:        General: Normal range of motion.     Comments: Positive Phalen test or Tinel's test  Skin:    General: Skin is warm and dry.   Neurological:     General: No focal deficit present.     Mental Status: She is alert and oriented to person, place, and time. Mental status is at baseline.  Psychiatric:        Mood and Affect: Mood normal.        Behavior: Behavior normal.        Thought Content: Thought content normal.        Judgment: Judgment normal.        Results for orders placed or performed during the hospital encounter of 06/03/23  Pregnancy, urine POC   Collection Time: 06/03/23  2:27 PM  Result Value Ref Range   Preg Test, Ur NEGATIVE NEGATIVE          Assessment & Plan:   Problem List Items Addressed This Visit       Digestive   Gastroesophageal reflux disease without esophagitis     Other   Depression - Primary   Relevant Medications   sertraline  (ZOLOFT ) 100 MG tablet   Anxiety   Relevant Medications   sertraline  (ZOLOFT ) 100 MG tablet   BMI 40.0-44.9, adult (HCC)   Relevant Orders   TSH   Mixed hyperlipidemia   Relevant Orders   Lipid panel   Other Visit Diagnoses       Encounter to establish care         Screening for diabetes mellitus       Relevant Orders   Comprehensive metabolic panel with GFR   Hemoglobin A1c     Screening for deficiency anemia       Relevant Orders   CBC with Differential/Platelet     Bilateral carpal tunnel syndrome       Relevant Medications   sertraline  (ZOLOFT ) 100 MG tablet   Other Relevant Orders   Ambulatory referral to Orthopedic Surgery        Assessment and Plan Assessment & Plan Bilateral Carpal Tunnel Syndrome Chronic bilateral wrist pain for three to four months, exacerbated by activities and relieved by wearing braces. Positive Phalen's and Tinel's tests suggest carpal tunnel syndrome. She works in jobs requiring repetitive hand movements, which may contribute to the condition. - Refer to orthopedic specialist for evaluation and discussion of potential surgical intervention - Continue wearing wrist  braces, especially at  night  Depression Depression for approximately 12 years, currently managed with Zoloft  100 mg daily. Depression screening score elevated at 14, indicating ongoing symptoms. She previously took 200 mg daily but reduced to 100 mg due to side effects, which she tolerates well. - Continue Zoloft  100 mg daily - Send prescription to [facility] in [place]  Anxiety Anxiety with [facility] elevated screening score of 10, indicating ongoing symptoms.  Hyperlipidemia Hyperlipidemia with previously elevated cholesterol levels. Blood work planned to assess current status and guide further management. - Order blood work to check cholesterol levels  Obesity Obesity with a current weight of 270 pounds and a BMI of 42.37. Blood work planned to check for diabetes as a contributing factor to her symptoms. - Order blood work to check for diabetes - Encourage continuation of lifestyle modifications, including dietary management and regular exercise. -continue to increase physical activity, getting at least 150 min of physical activity a week.  Work on including Runner, broadcasting/film/video 2 days a week.  - continue eating at a calorie deficit 1600-1700 cal a day, eating a well balanced diet with whole foods, avoiding processed foods.     General Health Maintenance Last Pap smear in 2018. Recent STD testing but no Pap smear performed last year. Need for cervical cancer screening discussed and agreed upon. - Schedule Pap smear at her convenience        Follow up plan: Return for cpe with pap.

## 2024-03-10 ENCOUNTER — Ambulatory Visit: Payer: Self-pay | Admitting: Nurse Practitioner

## 2024-03-10 LAB — COMPREHENSIVE METABOLIC PANEL WITH GFR
AG Ratio: 1.4 (calc) (ref 1.0–2.5)
ALT: 19 U/L (ref 6–29)
AST: 15 U/L (ref 10–30)
Albumin: 3.9 g/dL (ref 3.6–5.1)
Alkaline phosphatase (APISO): 58 U/L (ref 31–125)
BUN: 12 mg/dL (ref 7–25)
CO2: 26 mmol/L (ref 20–32)
Calcium: 9 mg/dL (ref 8.6–10.2)
Chloride: 106 mmol/L (ref 98–110)
Creat: 0.57 mg/dL (ref 0.50–0.97)
Globulin: 2.8 g/dL (ref 1.9–3.7)
Glucose, Bld: 99 mg/dL (ref 65–99)
Potassium: 3.9 mmol/L (ref 3.5–5.3)
Sodium: 139 mmol/L (ref 135–146)
Total Bilirubin: 0.4 mg/dL (ref 0.2–1.2)
Total Protein: 6.7 g/dL (ref 6.1–8.1)
eGFR: 119 mL/min/{1.73_m2} (ref >=60)

## 2024-03-10 LAB — HEMOGLOBIN A1C
Hgb A1c MFr Bld: 5.2 % (ref <5.7)
Mean Plasma Glucose: 103 mg/dL
eAG (mmol/L): 5.7 mmol/L

## 2024-03-10 LAB — LIPID PANEL
Cholesterol: 218 mg/dL — ABNORMAL HIGH (ref <200)
HDL: 52 mg/dL (ref >=50)
LDL Cholesterol (Calc): 126 mg/dL — ABNORMAL HIGH
Non-HDL Cholesterol (Calc): 166 mg/dL — ABNORMAL HIGH (ref <130)
Total CHOL/HDL Ratio: 4.2 (calc) (ref <5.0)
Triglycerides: 257 mg/dL — ABNORMAL HIGH (ref <150)

## 2024-03-10 LAB — CBC WITH DIFFERENTIAL/PLATELET
Absolute Lymphocytes: 1619 {cells}/uL (ref 850–3900)
Absolute Monocytes: 469 {cells}/uL (ref 200–950)
Basophils Absolute: 28 {cells}/uL (ref 0–200)
Basophils Relative: 0.3 %
Eosinophils Absolute: 166 {cells}/uL (ref 15–500)
Eosinophils Relative: 1.8 %
HCT: 38.9 % (ref 35.0–45.0)
Hemoglobin: 12.7 g/dL (ref 11.7–15.5)
MCH: 29.5 pg (ref 27.0–33.0)
MCHC: 32.6 g/dL (ref 32.0–36.0)
MCV: 90.3 fL (ref 80.0–100.0)
MPV: 11.7 fL (ref 7.5–12.5)
Monocytes Relative: 5.1 %
Neutro Abs: 6918 {cells}/uL (ref 1500–7800)
Neutrophils Relative %: 75.2 %
Platelets: 300 10*3/uL (ref 140–400)
RBC: 4.31 10*6/uL (ref 3.80–5.10)
RDW: 13.1 % (ref 11.0–15.0)
Total Lymphocyte: 17.6 %
WBC: 9.2 10*3/uL (ref 3.8–10.8)

## 2024-03-10 LAB — TSH: TSH: 2.24 m[IU]/L

## 2024-03-11 ENCOUNTER — Encounter: Admitting: Physician Assistant

## 2024-03-18 ENCOUNTER — Encounter: Admitting: Physician Assistant

## 2024-03-31 NOTE — Progress Notes (Deleted)
 Name: Carolyn Rosales   MRN: [id number]    DOB: [birth year]   Date:03/31/2024       Progress Note  Subjective  Chief Complaint  No chief complaint on file.   HPI  Patient presents for annual CPE.  Diet: *** Exercise: ***  Sleep: *** Last dental exam:*** Last eye exam: ***  Flowsheet Row Appointment from 03/11/2024 in [facility]  AUDIT-C Score 6    Depression: Phq 9 is  {Desc; negative/positive:13464}    03/09/2024    2:29 PM 06/02/2023    4:05 PM 02/16/2023    1:36 PM 11/11/2017    2:24 PM 07/28/2017   11:02 AM  Depression screen PHQ 2/9  Decreased Interest 2 0 0 0 0  Down, Depressed, Hopeless 1 0 0 1 1  PHQ - 2 Score 3 0 0 1 1  Altered sleeping 3  0 0   Tired, decreased energy 2  1 1    Change in appetite 3  0 0   Feeling bad or failure about yourself  1  0 0   Trouble concentrating 0  0 0   Moving slowly or fidgety/restless 2  0 0   Suicidal thoughts 0  0 0   PHQ-9 Score 14  1 2    Difficult doing work/chores   Not difficult at all Somewhat difficult    Hypertension: BP Readings from Last 3 Encounters:  03/09/24 124/72  09/17/23 128/81  07/14/23 137/83   Obesity: Wt Readings from Last 3 Encounters:  03/09/24 270 lb 8 oz (122.7 kg)  09/17/23 263 lb 9.6 oz (119.6 kg)  07/14/23 263 lb (119.3 kg)   BMI Readings from Last 3 Encounters:  03/09/24 42.37 kg/m  09/17/23 41.29 kg/m  07/14/23 41.19 kg/m     Vaccines:  HPV: up to at age 39 , ask insurance if age between 36-45  Shingrix: 39-64 yo and ask insurance if covered when patient above 39 yo Pneumonia: *** educated and discussed with patient. Flu: *** educated and discussed with patient.  Hep C Screening: *** STD testing and prevention (HIV/chl/gon/syphilis): *** Intimate partner violence:*** Sexual History : Menstrual History/LMP/Abnormal Bleeding:  Incontinence Symptoms:   Breast cancer:  - Last Mammogram: *** - BRCA gene screening: ***  Osteoporosis:  Discussed high calcium  and vitamin D supplementation, weight bearing exercises  Cervical cancer screening: ***  Skin cancer: Discussed monitoring for atypical lesions  Colorectal cancer: ***   Lung cancer:  *** Low Dose CT Chest recommended if Age 95-80 years, 20 pack-year currently smoking OR have quit w/in 15years. Patient {DOES NOT does:27190::does not} qualify.   ECG: ***  Advanced Care Planning: A voluntary discussion about advance care planning including the explanation and discussion of advance directives.  Discussed health care proxy and Living will, and the patient was able to identify a health care proxy as ***.  Patient {DOES_DOES WNU:81435} have a living will at present time. If patient does have living will, I have requested they bring this to the clinic to be scanned in to their chart.  Lipids: Lab Results  Component Value Date   CHOL 218 (H) 03/09/2024   CHOL 172 02/16/2023   Lab Results  Component Value Date   HDL 52 03/09/2024   HDL 35 (L) 02/16/2023   Lab Results  Component Value Date   LDLCALC 126 (H) 03/09/2024   LDLCALC 101 (H) 02/16/2023   Lab Results  Component Value Date   TRIG 257 (H) 03/09/2024  TRIG 206 (H) 02/16/2023   Lab Results  Component Value Date   CHOLHDL 4.2 03/09/2024   CHOLHDL 4.9 (H) 02/16/2023   No results found for: LDLDIRECT  Glucose: Glucose  Date Value Ref Range Status  02/16/2023 93 70 - 99 mg/dL Final  88/89/7985 885 (H) 65 - 99 mg/dL Final   Glucose, Bld  Date Value Ref Range Status  03/09/2024 99 65 - 99 mg/dL Final    Comment:    .            Fasting reference interval .   06/01/2023 121 (H) 70 - 99 mg/dL Final    Comment:    Glucose reference range applies only to samples taken after fasting for at least 8 hours.  08/06/2017 80 65 - 99 mg/dL Final   Glucose-Capillary  Date Value Ref Range Status  08/07/2017 128 (H) 65 - 99 mg/dL Final  88/97/7981 898 (H) 65 - 99 mg/dL Final  88/97/7981 820 (H) 65 - 99  mg/dL Final    Patient Active Problem List   Diagnosis Date Noted   Mixed hyperlipidemia 03/09/2024   Gastroesophageal reflux disease without esophagitis 03/09/2024   Status post hemorrhoidectomy 06/25/2023   Vaginal discharge 06/02/2023   History of chlamydia infection 06/01/2023   Lactose intolerance in adult 02/17/2023   Urinary incontinence in female 08/11/2017   BMI 40.0-44.9, adult (HCC) 07/01/2017   Herpes genitalis 04/13/2017   History of abnormal cervical Pap smear 04/13/2017   Depression 04/09/2017   Anxiety 04/09/2017   Marijuana use 04/09/2017    Past Surgical History:  Procedure Laterality Date   HEMORRHOID SURGERY N/A 06/03/2023   Procedure: HEMORRHOIDECTOMY, 2 plus columns;  Surgeon: Lane Shope, MD;  Location: [facility] ORS;  Service: General;  Laterality: N/A;   NO PAST SURGERIES      Family History  Problem Relation Age of Onset   Diabetes Mellitus II Mother    Hypertension Mother    Diabetes Father    Breast cancer Paternal Grandmother    Breast cancer Maternal Aunt    Breast cancer Paternal Aunt     Social History   Socioeconomic History   Marital status: Married    Spouse name: Tammie   Number of children: 1   Years of education: Not on file   Highest education level: Associate degree: occupational, Scientist, product/process development, or vocational program  Occupational History   Not on file  Tobacco Use   Smoking status: Former    Current packs/day: 0.00    Average packs/day: 0.5 packs/day for 3.0 years (1.5 ttl pk-yrs)    Types: Cigarettes    Quit date: 12/28/2014    Years since quitting: 9.2    Passive exposure: Past   Smokeless tobacco: Never  Vaping Use   Vaping status: Every Day   Substances: THC, CBD  Substance and Sexual Activity   Alcohol use: Yes    Alcohol/week: 5.0 standard drinks of alcohol    Types: 5 Shots of liquor per week    Comment: 2 x's month   Drug use: Not Currently    Frequency: 7.0 times per week    Types: Marijuana    Comment:  History of daily marijuana use prior to pregnancy,  daily user  , admits to using cocaine many times this year 2024   Sexual activity: Not Currently    Partners: Male    Birth control/protection: I.U.D.  Other Topics Concern   Not on file  Social History Narrative   Not on file  Social Drivers of Health   Financial Resource Strain: Medium Risk (03/08/2024)   Overall Financial Resource Strain (CARDIA)    Difficulty of Paying Living Expenses: Somewhat hard  Food Insecurity: Food Insecurity Present (03/08/2024)   Hunger Vital Sign    Worried About Running Out of Food in the Last Year: Sometimes true    Ran Out of Food in the Last Year: Sometimes true  Transportation Needs: No Transportation Needs (03/08/2024)   PRAPARE - Administrator, Civil Service (Medical): No    Lack of Transportation (Non-Medical): No  Physical Activity: Sufficiently Active (03/08/2024)   Exercise Vital Sign    Days of Exercise per Week: 3 days    Minutes of Exercise per Session: 60 min  Stress: No Stress Concern Present (03/08/2024)   [employer] of Occupational Health - Occupational Stress Questionnaire    Feeling of Stress : Only a little  Social Connections: Moderately Isolated (03/08/2024)   Social Connection and Isolation Panel    Frequency of Communication with Friends and Family: Three times a week    Frequency of Social Gatherings with Friends and Family: Once a week    Attends Religious Services: 1 to 4 times per year    Active Member of [employer] or Organizations: No    Attends Engineer, structural: Not on file    Marital Status: Separated  Intimate Partner Violence: Not At Risk (03/09/2024)   Humiliation, Afraid, Rape, and Kick questionnaire    Fear of Current or Ex-Partner: No    Emotionally Abused: No    Physically Abused: No    Sexually Abused: No     Current Outpatient Medications:    acetaminophen  (TYLENOL ) 500 MG tablet, Take 1,000 mg by mouth every 6 (six) hours as  needed., Disp: , Rfl:    ibuprofen  (ADVIL ) 800 MG tablet, Take 1 tablet (800 mg total) by mouth every 8 (eight) hours as needed., Disp: 30 tablet, Rfl: 0   levonorgestrel  (MIRENA ) 20 MCG/24HR IUD, 1 each by Intrauterine route once., Disp: , Rfl:    omeprazole  (PRILOSEC) 20 MG capsule, Take 20 mg by mouth daily., Disp: , Rfl:    sertraline  (ZOLOFT ) 100 MG tablet, Take 1 tablet (100 mg total) by mouth daily., Disp: 90 tablet, Rfl: 1   valACYclovir  (VALTREX ) 500 MG tablet, TAKE 1 TABLET(500 MG) BY MOUTH TWICE DAILY FOR 3 DAYS. START WITHIN 24 HOURS OF SYMPTOM ONSET, Disp: 6 tablet, Rfl: 0  No Known Allergies   ROS  ***  Objective  There were no vitals filed for this visit.  There is no height or weight on file to calculate BMI.  Physical Exam ***  Recent Results (from the past 2160 hours)  CBC with Differential/Platelet     Status: None   Collection Time: 03/09/24  2:58 PM  Result Value Ref Range   WBC 9.2 3.8 - 10.8 Thousand/uL   RBC 4.31 3.80 - 5.10 Million/uL   Hemoglobin 12.7 11.7 - 15.5 g/dL   HCT 61.0 64.9 - 54.9 %   MCV 90.3 80.0 - 100.0 fL   MCH 29.5 27.0 - 33.0 pg   MCHC 32.6 32.0 - 36.0 g/dL    Comment: For adults, a slight decrease in the calculated MCHC value (in the range of 30 to 32 g/dL) is most likely not clinically significant; however, it should be interpreted with caution in correlation with other red cell parameters and the patient's clinical condition.    RDW 13.1 11.0 - 15.0 %  Platelets 300 140 - 400 Thousand/uL   MPV 11.7 7.5 - 12.5 fL   Neutro Abs 6,918 1,500 - 7,800 cells/uL   Absolute Lymphocytes 1,619 850 - 3,900 cells/uL   Absolute Monocytes 469 200 - 950 cells/uL   Eosinophils Absolute 166 15 - 500 cells/uL   Basophils Absolute 28 0 - 200 cells/uL   Neutrophils Relative % 75.2 %   Total Lymphocyte 17.6 %   Monocytes Relative 5.1 %   Eosinophils Relative 1.8 %   Basophils Relative 0.3 %  Comprehensive metabolic panel with GFR      Status: None   Collection Time: 03/09/24  2:58 PM  Result Value Ref Range   Glucose, Bld 99 65 - 99 mg/dL    Comment: .            Fasting reference interval .    BUN 12 7 - 25 mg/dL   Creat 9.42 9.49 - 9.02 mg/dL   eGFR 880 > OR = 60 fO/fpw/8.26f7   BUN/Creatinine Ratio SEE NOTE: 6 - 22 (calc)    Comment:    Not Reported: BUN and Creatinine are within    reference range. .    Sodium 139 135 - 146 mmol/L   Potassium 3.9 3.5 - 5.3 mmol/L   Chloride 106 98 - 110 mmol/L   CO2 26 20 - 32 mmol/L   Calcium  9.0 8.6 - 10.2 mg/dL   Total Protein 6.7 6.1 - 8.1 g/dL   Albumin 3.9 3.6 - 5.1 g/dL   Globulin 2.8 1.9 - 3.7 g/dL (calc)   AG Ratio 1.4 1.0 - 2.5 (calc)   Total Bilirubin 0.4 0.2 - 1.2 mg/dL   Alkaline phosphatase (APISO) 58 31 - 125 U/L   AST 15 10 - 30 U/L   ALT 19 6 - 29 U/L  Lipid panel     Status: Abnormal   Collection Time: 03/09/24  2:58 PM  Result Value Ref Range   Cholesterol 218 (H) <200 mg/dL   HDL 52 > OR = 50 mg/dL   Triglycerides 742 (H) <150 mg/dL    Comment: . If a non-fasting specimen was collected, consider repeat triglyceride testing on a fasting specimen if clinically indicated.  Veatrice et al. J. of Clin. Lipidol. 2015;9:129-169. SABRA    LDL Cholesterol (Calc) 126 (H) mg/dL (calc)    Comment: Reference range: <100 . Desirable range <100 mg/dL for primary prevention;   <70 mg/dL for patients with CHD or diabetic patients  with > or = 2 CHD risk factors. SABRA LDL-C is now calculated using the Martin-Hopkins  calculation, which is a validated novel method providing  better accuracy than the Friedewald equation in the  estimation of LDL-C.  Gladis APPLETHWAITE et al. SANDREA. 7986;689(80): 2061-2068  (http://education.[facility].com/faq/FAQ164)    Total CHOL/HDL Ratio 4.2 <5.0 (calc)   Non-HDL Cholesterol (Calc) 166 (H) <130 mg/dL (calc)    Comment: For patients with diabetes plus 1 major ASCVD risk  factor, treating to a non-HDL-C goal of <100 mg/dL   (LDL-C of <29 mg/dL) is considered a therapeutic  option.   Hemoglobin A1c     Status: None   Collection Time: 03/09/24  2:58 PM  Result Value Ref Range   Hgb A1c MFr Bld 5.2 <5.7 %    Comment: For the purpose of screening for the presence of diabetes: . <5.7%       Consistent with the absence of diabetes 5.7-6.4%    Consistent with increased risk for diabetes             (  prediabetes) > or =6.5%  Consistent with diabetes . This assay result is consistent with a decreased risk of diabetes. . Currently, no consensus exists regarding use of hemoglobin A1c for diagnosis of diabetes in children. . According to American Diabetes Association (ADA) guidelines, hemoglobin A1c <7.0% represents optimal control in non-pregnant diabetic patients. Different metrics may apply to specific patient populations.  Standards of Medical Care in Diabetes(ADA). .    Mean Plasma Glucose 103 mg/dL   eAG (mmol/L) 5.7 mmol/L  TSH     Status: None   Collection Time: 03/09/24  2:58 PM  Result Value Ref Range   TSH 2.24 mIU/L    Comment:           Reference Range .           > or = 20 Years  0.40-4.50 .                Pregnancy Ranges           First trimester    0.26-2.66           Second trimester   0.55-2.73           Third trimester    0.43-2.91     Diabetic Foot Exam: Diabetic Foot Exam - Simple   No data filed    ***  Fall Risk:    03/09/2024    2:21 PM 06/02/2023    4:05 PM 02/16/2023    1:36 PM 03/10/2019    3:58 PM 03/10/2019    3:53 PM  Fall Risk   Falls in the past year? 0 0 0 0  0   Number falls in past yr: 0  0  0   Injury with Fall? 0 0 0  0  Risk for fall due to :  No Fall Risks No Fall Risks    Follow up  Falls evaluation completed Falls evaluation completed Falls evaluation completed       Data saved with a previous flowsheet row definition   ***  Functional Status Survey:   ***  Assessment & Plan  There are no diagnoses linked to this encounter.  -USPSTF  grade A and B recommendations reviewed with patient; age-appropriate recommendations, preventive care, screening tests, etc discussed and encouraged; healthy living encouraged; see AVS for patient education given to patient -Discussed importance of 150 minutes of physical activity weekly, eat two servings of fish weekly, eat one serving of tree nuts ( cashews, pistachios, pecans, almonds.SABRA) every other day, eat 6 servings of fruit/vegetables daily and drink plenty of water and avoid sweet beverages.   -Reviewed Health Maintenance: ***

## 2024-04-01 ENCOUNTER — Encounter: Admitting: Nurse Practitioner

## 2024-04-12 ENCOUNTER — Ambulatory Visit (INDEPENDENT_AMBULATORY_CARE_PROVIDER_SITE_OTHER): Admitting: Nurse Practitioner

## 2024-04-12 VITALS — BP 122/78 | HR 86 | Temp 98.0°F | Resp 18 | Ht 66.5 in | Wt 268.9 lb

## 2024-04-12 DIAGNOSIS — L259 Unspecified contact dermatitis, unspecified cause: Secondary | ICD-10-CM | POA: Diagnosis not present

## 2024-04-12 MED ORDER — PREDNISONE 10 MG (21) PO TBPK: ORAL_TABLET | ORAL | 0 refills | Status: DC

## 2024-04-12 MED ORDER — TRIAMCINOLONE ACETONIDE 0.1 % EX OINT: 1.0000 | TOPICAL_OINTMENT | Freq: Two times a day (BID) | CUTANEOUS | 0 refills | Status: DC

## 2024-04-12 NOTE — Progress Notes (Signed)
 BP 122/78   Pulse 86   Temp 98 F (36.7 C)   Resp 18   Ht 5' 6.5 (1.689 m)   Wt 268 lb 14.4 oz (122 kg)   SpO2 98%   BMI 42.75 kg/m    Subjective:    Patient ID: Carolyn Rosales, female    DOB: Jan 12, 1986, 38 y.o.   MRN: [id number]  HPI: Carolyn Rosales is a 38 y.o. female  Chief Complaint  Patient presents with   Rash    Spreading and very itchy    Discussed the use of AI scribe software for clinical note transcription with the patient, who gave verbal consent to proceed.  History of Present Illness Carolyn Rosales is a 38 year old female who presents with an acute rash.  The rash began approximately a week ago near her ankle, characterized by small red bumps that are not easily visible until scratched. It primarily affects her legs but has started to spread to other areas, raising concern about its progression.  The rash is associated with significant itching, particularly when she brushes against something or is under covers at night, which has disrupted her sleep. The itching is intense enough to have prevented her from sleeping last night.  She works outdoors, specifically weeding around wooded areas, which she initially thought might be related to the rash. She has not taken any medication for the rash, hoping it would resolve on its own.  She has a history of poison oak exposure but notes that this rash does not resemble her previous experiences with poison oak. No prior use of steroids and she is unsure of her tolerance to them.          03/09/2024    2:29 PM 06/02/2023    4:05 PM 02/16/2023    1:36 PM  Depression screen PHQ 2/9  Decreased Interest 2 0 0  Down, Depressed, Hopeless 1 0 0  PHQ - 2 Score 3 0 0  Altered sleeping 3  0  Tired, decreased energy 2  1  Change in appetite 3  0  Feeling bad or failure about yourself  1  0  Trouble concentrating 0  0  Moving slowly or fidgety/restless 2  0  Suicidal thoughts 0  0   PHQ-9 Score 14  1  Difficult doing work/chores   Not difficult at all    Relevant past medical, surgical, family and social history reviewed and updated as indicated. Interim medical history since our last visit reviewed. Allergies and medications reviewed and updated.  Review of Systems  Ten systems reviewed and is negative except as mentioned in HPI      Objective:     BP 122/78   Pulse 86   Temp 98 F (36.7 C)   Resp 18   Ht 5' 6.5 (1.689 m)   Wt 268 lb 14.4 oz (122 kg)   SpO2 98%   BMI 42.75 kg/m    Wt Readings from Last 3 Encounters:  04/12/24 268 lb 14.4 oz (122 kg)  03/09/24 270 lb 8 oz (122.7 kg)  09/17/23 263 lb 9.6 oz (119.6 kg)    Physical Exam Physical Exam GENERAL: Alert, cooperative, well developed, no acute distress. HEENT: Normocephalic, normal oropharynx, moist mucous membranes. CHEST: Clear to auscultation bilaterally, no wheezes, rhonchi, or crackles. CARDIOVASCULAR: Normal heart rate and rhythm, S1 and S2 normal without murmurs. ABDOMEN: Soft, non-tender, non-distended, without organomegaly, normal bowel sounds. EXTREMITIES: No cyanosis or  edema. NEUROLOGICAL: Cranial nerves grossly intact, moves all extremities without gross motor or sensory deficit. SKIN: Red bumps with welts on legs   Results for orders placed or performed in visit on 03/09/24  CBC with Differential/Platelet   Collection Time: 03/09/24  2:58 PM  Result Value Ref Range   WBC 9.2 3.8 - 10.8 Thousand/uL   RBC 4.31 3.80 - 5.10 Million/uL   Hemoglobin 12.7 11.7 - 15.5 g/dL   HCT 61.0 64.9 - 54.9 %   MCV 90.3 80.0 - 100.0 fL   MCH 29.5 27.0 - 33.0 pg   MCHC 32.6 32.0 - 36.0 g/dL   RDW 86.8 88.9 - 84.9 %   Platelets 300 140 - 400 Thousand/uL   MPV 11.7 7.5 - 12.5 fL   Neutro Abs 6,918 1,500 - 7,800 cells/uL   Absolute Lymphocytes 1,619 850 - 3,900 cells/uL   Absolute Monocytes 469 200 - 950 cells/uL   Eosinophils Absolute 166 15 - 500 cells/uL   Basophils Absolute 28 0 -  200 cells/uL   Neutrophils Relative % 75.2 %   Total Lymphocyte 17.6 %   Monocytes Relative 5.1 %   Eosinophils Relative 1.8 %   Basophils Relative 0.3 %  Comprehensive metabolic panel with GFR   Collection Time: 03/09/24  2:58 PM  Result Value Ref Range   Glucose, Bld 99 65 - 99 mg/dL   BUN 12 7 - 25 mg/dL   Creat 9.42 9.49 - 9.02 mg/dL   eGFR 880 > OR = 60 fO/fpw/8.26f7   BUN/Creatinine Ratio SEE NOTE: 6 - 22 (calc)   Sodium 139 135 - 146 mmol/L   Potassium 3.9 3.5 - 5.3 mmol/L   Chloride 106 98 - 110 mmol/L   CO2 26 20 - 32 mmol/L   Calcium  9.0 8.6 - 10.2 mg/dL   Total Protein 6.7 6.1 - 8.1 g/dL   Albumin 3.9 3.6 - 5.1 g/dL   Globulin 2.8 1.9 - 3.7 g/dL (calc)   AG Ratio 1.4 1.0 - 2.5 (calc)   Total Bilirubin 0.4 0.2 - 1.2 mg/dL   Alkaline phosphatase (APISO) 58 31 - 125 U/L   AST 15 10 - 30 U/L   ALT 19 6 - 29 U/L  Lipid panel   Collection Time: 03/09/24  2:58 PM  Result Value Ref Range   Cholesterol 218 (H) <200 mg/dL   HDL 52 > OR = 50 mg/dL   Triglycerides 742 (H) <150 mg/dL   LDL Cholesterol (Calc) 126 (H) mg/dL (calc)   Total CHOL/HDL Ratio 4.2 <5.0 (calc)   Non-HDL Cholesterol (Calc) 166 (H) <130 mg/dL (calc)  Hemoglobin J8r   Collection Time: 03/09/24  2:58 PM  Result Value Ref Range   Hgb A1c MFr Bld 5.2 <5.7 %   Mean Plasma Glucose 103 mg/dL   eAG (mmol/L) 5.7 mmol/L  TSH   Collection Time: 03/09/24  2:58 PM  Result Value Ref Range   TSH 2.24 mIU/L          Assessment & Plan:   Problem List Items Addressed This Visit   None Visit Diagnoses       Rash    -  Primary        Assessment and Plan Assessment & Plan Contact dermatitis Acute onset of pruritic rash primarily on the legs, consistent with contact dermatitis. Rash consists of small red bumps that become more prominent and itchy upon scratching. 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 Symptoms have disrupted sleep, indicating significant discomfort. - Prescribe  a topical steroid  cream to be applied twice daily. - Prescribe an oral steroid taper to reduce immune response and inflammation. 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 - Recommend taking Zyrtec or Claritin once daily for histamine 1 receptor blockade. - Advise taking Benadryl  at bedtime to aid with sleep and reduce itching. - Instruct her to send a message in a couple of days to report on progress.        Follow up plan: Return for cpe w/ pap.

## 2024-04-12 NOTE — Patient Instructions (Signed)
 Pepcid 2 times day Zyrtec or Claritin daily  Benadryl  at night

## 2024-05-04 ENCOUNTER — Ambulatory Visit (INDEPENDENT_AMBULATORY_CARE_PROVIDER_SITE_OTHER): Admitting: Nurse Practitioner

## 2024-05-04 ENCOUNTER — Encounter: Payer: Self-pay | Admitting: Nurse Practitioner

## 2024-05-04 ENCOUNTER — Other Ambulatory Visit (HOSPITAL_COMMUNITY)
Admission: RE | Admit: 2024-05-04 | Discharge: 2024-05-04 | Disposition: A | Discharge status: Home / Self Care | Source: Ambulatory Visit | Attending: Nurse Practitioner | Admitting: Nurse Practitioner

## 2024-05-04 VITALS — BP 128/70 | HR 89 | Resp 16 | Ht 66.0 in | Wt 259.0 lb

## 2024-05-04 DIAGNOSIS — Z124 Encounter for screening for malignant neoplasm of cervix: Secondary | ICD-10-CM | POA: Insufficient documentation

## 2024-05-04 DIAGNOSIS — Z0000 Encounter for general adult medical examination without abnormal findings: Secondary | ICD-10-CM

## 2024-05-04 NOTE — Progress Notes (Signed)
 Name: Carolyn Rosales   MRN: [id number]    DOB: [birth year]   Date:05/04/2024       Progress Note  Subjective  Chief Complaint  Chief Complaint  Patient presents with   Annual Exam    HPI  Patient presents for annual CPE. Discussed the use of AI scribe software for clinical note transcription with the patient, who gave verbal consent to proceed.  History of Present Illness Carolyn Rosales Onstott is a 38 year old female who presents for a routine physical exam.   Sleep disturbance - Sleep duration reduced to four to five hours per night recently - Trial of two melatonin tablets without improvement - One episode of only two hours of sleep, resulting in missing work to rest  Abnormal cervical cytology - History of abnormal Pap smears requiring consistent follow-up procedures - Frustration with recurrent abnormal results and need for additional interventions  Urinary stress incontinence - Stress incontinence with sneezing or coughing - Performs Kegel exercises, but not as frequently as recommended  Abnormal uterine bleeding - Last menstrual cycle was atypical due to IUD - Unexpected heavy bleeding occurred two weeks ago, which is unusual as she typically does not bleed    Diet: tries to eat well balanced, less appetite  Exercise: mow yards daily  Sleep: 4-5 hours, last few nights has had some trouble lately Last dental exam:awhile ago Last eye exam: long time ago  [employer] Visit from 04/12/2024 in [facility]  AUDIT-C Score 6    Depression: Phq 9 is  positive    05/04/2024   10:05 AM 03/09/2024    2:29 PM 06/02/2023    4:05 PM 02/16/2023    1:36 PM 11/11/2017    2:24 PM  Depression screen PHQ 2/9  Decreased Interest 0 2 0 0 0  Down, Depressed, Hopeless 0 1 0 0 1  PHQ - 2 Score 0 3 0 0 1  Altered sleeping 3 3  0 0  Tired, decreased energy 3 2  1 1   Change in appetite 3 3  0 0  Feeling bad or failure about yourself  0  1  0 0  Trouble concentrating 1 0  0 0  Moving slowly or fidgety/restless 0 2  0 0  Suicidal thoughts 0 0  0 0  PHQ-9 Score 10 14  1 2   Difficult doing work/chores    Not difficult at all Somewhat difficult   Hypertension: BP Readings from Last 3 Encounters:  05/04/24 128/70  04/12/24 122/78  03/09/24 124/72   Obesity: Wt Readings from Last 3 Encounters:  05/04/24 259 lb (117.5 kg)  04/12/24 268 lb 14.4 oz (122 kg)  03/09/24 270 lb 8 oz (122.7 kg)  Waist Measurement : 54 inches   BMI Readings from Last 3 Encounters:  05/04/24 41.80 kg/m  04/12/24 42.75 kg/m  03/09/24 42.37 kg/m     Vaccines:  HPV: up to at age 77 , ask insurance if age between 68-45  Shingrix: 21-64 yo and ask insurance if covered when patient above 62 yo Pneumonia:  educated and discussed with patient. Flu:  educated and discussed with patient.  Hep C Screening: completed STD testing and prevention (HIV/chl/gon/syphilis): completed Intimate partner violence: none Sexual History :not currently Menstrual History/LMP/Abnormal Bleeding: [facility]: IUD,  2 weeks ago did have episode of bleeding Incontinence Symptoms: stress incontinence   Breast cancer:  - Last Mammogram: does not qualify - BRCA gene screening: none  Osteoporosis:  Discussed high calcium  and vitamin D supplementation, weight bearing exercises  Cervical cancer screening: 09/24/2017, due  Skin cancer: Discussed monitoring for atypical lesions  Colorectal cancer: does not qualify   Lung cancer:   Low Dose CT Chest recommended if Age 89-80 years, 20 pack-year currently smoking OR have quit w/in 15years. Patient does not qualify.   ECG: none  Advanced Care Planning: A voluntary discussion about advance care planning including the explanation and discussion of advance directives.  Discussed health care proxy and Living will, and the patient was able to identify a health care proxy as Dad.  Patient does not have a living will at present time. If  patient does have living will, I have requested they bring this to the clinic to be scanned in to their chart.  Lipids: Lab Results  Component Value Date   CHOL 218 (H) 03/09/2024   CHOL 172 02/16/2023   Lab Results  Component Value Date   HDL 52 03/09/2024   HDL 35 (L) 02/16/2023   Lab Results  Component Value Date   LDLCALC 126 (H) 03/09/2024   LDLCALC 101 (H) 02/16/2023   Lab Results  Component Value Date   TRIG 257 (H) 03/09/2024   TRIG 206 (H) 02/16/2023   Lab Results  Component Value Date   CHOLHDL 4.2 03/09/2024   CHOLHDL 4.9 (H) 02/16/2023   No results found for: LDLDIRECT  Glucose: Glucose  Date Value Ref Range Status  02/16/2023 93 70 - 99 mg/dL Final  88/89/7985 885 (H) 65 - 99 mg/dL Final   Glucose, Bld  Date Value Ref Range Status  03/09/2024 99 65 - 99 mg/dL Final    Comment:    .            Fasting reference interval .   06/01/2023 121 (H) 70 - 99 mg/dL Final    Comment:    Glucose reference range applies only to samples taken after fasting for at least 8 hours.  08/06/2017 80 65 - 99 mg/dL Final   Glucose-Capillary  Date Value Ref Range Status  08/07/2017 128 (H) 65 - 99 mg/dL Final  88/97/7981 898 (H) 65 - 99 mg/dL Final  88/97/7981 820 (H) 65 - 99 mg/dL Final    Patient Active Problem List   Diagnosis Date Noted   Mixed hyperlipidemia 03/09/2024   Gastroesophageal reflux disease without esophagitis 03/09/2024   Status post hemorrhoidectomy 06/25/2023   Vaginal discharge 06/02/2023   History of chlamydia infection 06/01/2023   Lactose intolerance in adult 02/17/2023   Urinary incontinence in female 08/11/2017   BMI 40.0-44.9, adult (HCC) 07/01/2017   Herpes genitalis 04/13/2017   History of abnormal cervical Pap smear 04/13/2017   Depression 04/09/2017   Anxiety 04/09/2017   Marijuana use 04/09/2017    Past Surgical History:  Procedure Laterality Date   HEMORRHOID SURGERY N/A 06/03/2023   Procedure: HEMORRHOIDECTOMY, 2 plus  columns;  Surgeon: Lane Shope, MD;  Location: [facility] ORS;  Service: General;  Laterality: N/A;   NO PAST SURGERIES      Family History  Problem Relation Age of Onset   Diabetes Mellitus II Mother    Hypertension Mother    Diabetes Father    Breast cancer Paternal Grandmother    Breast cancer Maternal Aunt    Breast cancer Paternal Aunt     Social History   Socioeconomic History   Marital status: Married    Spouse name: Tammie   Number of children: 1   Years of education:  Not on file   Highest education level: Associate degree: occupational, technical, or vocational program  Occupational History   Not on file  Tobacco Use   Smoking status: Former    Current packs/day: 0.00    Average packs/day: 0.5 packs/day for 3.0 years (1.5 ttl pk-yrs)    Types: Cigarettes, E-cigarettes    Quit date: 12/28/2014    Years since quitting: 9.3    Passive exposure: Past   Smokeless tobacco: Never  Vaping Use   Vaping status: Every Day   Substances: THC, CBD  Substance and Sexual Activity   Alcohol use: Yes    Alcohol/week: 5.0 standard drinks of alcohol    Types: 5 Shots of liquor per week    Comment: 2 x's month   Drug use: Not Currently    Frequency: 7.0 times per week    Types: Marijuana    Comment: History of daily marijuana use prior to pregnancy,  daily user  , admits to using cocaine many times this year 2024   Sexual activity: Not Currently    Partners: Male    Birth control/protection: I.U.D.  Other Topics Concern   Not on file  Social History Narrative   Not on file   Social Drivers of Health   Financial Resource Strain: Medium Risk (04/12/2024)   Overall Financial Resource Strain (CARDIA)    Difficulty of Paying Living Expenses: Somewhat hard  Food Insecurity: Food Insecurity Present (04/12/2024)   Hunger Vital Sign    Worried About Running Out of Food in the Last Year: Sometimes true    Ran Out of Food in the Last Year: Sometimes true  Transportation Needs: No  Transportation Needs (04/12/2024)   PRAPARE - Administrator, Civil Service (Medical): No    Lack of Transportation (Non-Medical): No  Physical Activity: Sufficiently Active (04/12/2024)   Exercise Vital Sign    Days of Exercise per Week: 3 days    Minutes of Exercise per Session: 60 min  Stress: No Stress Concern Present (04/12/2024)   [employer] of Occupational Health - Occupational Stress Questionnaire    Feeling of Stress: Only a little  Social Connections: Moderately Isolated (04/12/2024)   Social Connection and Isolation Panel    Frequency of Communication with Friends and Family: More than three times a week    Frequency of Social Gatherings with Friends and Family: Twice a week    Attends Religious Services: 1 to 4 times per year    Active Member of [employer] or Organizations: No    Attends Banker Meetings: Not on file    Marital Status: Separated  Intimate Partner Violence: Not At Risk (03/09/2024)   Humiliation, Afraid, Rape, and Kick questionnaire    Fear of Current or Ex-Partner: No    Emotionally Abused: No    Physically Abused: No    Sexually Abused: No     Current Outpatient Medications:    acetaminophen  (TYLENOL ) 500 MG tablet, Take 1,000 mg by mouth every 6 (six) hours as needed., Disp: , Rfl:    ibuprofen  (ADVIL ) 800 MG tablet, Take 1 tablet (800 mg total) by mouth every 8 (eight) hours as needed., Disp: 30 tablet, Rfl: 0   levonorgestrel  (MIRENA ) 20 MCG/24HR IUD, 1 each by Intrauterine route once., Disp: , Rfl:    omeprazole  (PRILOSEC) 20 MG capsule, Take 20 mg by mouth daily., Disp: , Rfl:    sertraline  (ZOLOFT ) 100 MG tablet, Take 1 tablet (100 mg total) by mouth daily., Disp:  90 tablet, Rfl: 1   valACYclovir  (VALTREX ) 500 MG tablet, TAKE 1 TABLET(500 MG) BY MOUTH TWICE DAILY FOR 3 DAYS. START WITHIN 24 HOURS OF SYMPTOM ONSET, Disp: 6 tablet, Rfl: 0  No Known Allergies   ROS  Constitutional: Negative for fever or weight change.   Respiratory: Negative for cough and shortness of breath.   Cardiovascular: Negative for chest pain or palpitations.  Gastrointestinal: Negative for abdominal pain, no bowel changes.  Musculoskeletal: Negative for gait problem or joint swelling.  Skin: Negative for rash.  Neurological: Negative for dizziness or headache.  No other specific complaints in a complete review of systems (except as listed in HPI above).   Objective  Vitals:   05/04/24 1001  BP: 128/70  Pulse: 89  Resp: 16  SpO2: 99%  Weight: 259 lb (117.5 kg)  Height: 5' 6 (1.676 m)    Body mass index is 41.8 kg/m.  Physical Exam Vitals reviewed. Exam conducted with a chaperone present.  Constitutional:      Appearance: Normal appearance.  HENT:     Head: Normocephalic.     Right Ear: Tympanic membrane normal.     Left Ear: Tympanic membrane normal.     Nose: Nose normal.  Eyes:     Extraocular Movements: Extraocular movements intact.     Conjunctiva/sclera: Conjunctivae normal.     Pupils: Pupils are equal, round, and reactive to light.  Neck:     Thyroid: No thyroid mass, thyromegaly or thyroid tenderness.  Cardiovascular:     Rate and Rhythm: Normal rate and regular rhythm.     Pulses: Normal pulses.     Heart sounds: Normal heart sounds.  Pulmonary:     Effort: Pulmonary effort is normal.     Breath sounds: Normal breath sounds.  Chest:  Breasts:    Right: Normal.     Left: Normal.  Abdominal:     General: Bowel sounds are normal.     Palpations: Abdomen is soft.  Genitourinary:    Vagina: Normal.     Cervix: Normal.     Uterus: Normal.      Adnexa: Right adnexa normal and left adnexa normal.  Musculoskeletal:        General: Normal range of motion.     Cervical back: Normal range of motion and neck supple.     Right lower leg: No edema.     Left lower leg: No edema.  Skin:    General: Skin is warm and dry.     Capillary Refill: Capillary refill takes less than 2 seconds.   Neurological:     General: No focal deficit present.     Mental Status: She is alert and oriented to person, place, and time. Mental status is at baseline.  Psychiatric:        Mood and Affect: Mood normal.        Behavior: Behavior normal.        Thought Content: Thought content normal.        Judgment: Judgment normal.      Recent Results (from the past 2160 hours)  CBC with Differential/Platelet     Status: None   Collection Time: 03/09/24  2:58 PM  Result Value Ref Range   WBC 9.2 3.8 - 10.8 Thousand/uL   RBC 4.31 3.80 - 5.10 Million/uL   Hemoglobin 12.7 11.7 - 15.5 g/dL   HCT 61.0 64.9 - 54.9 %   MCV 90.3 80.0 - 100.0 fL   MCH 29.5 27.0 -  33.0 pg   MCHC 32.6 32.0 - 36.0 g/dL    Comment: For adults, a slight decrease in the calculated MCHC value (in the range of 30 to 32 g/dL) is most likely not clinically significant; however, it should be interpreted with caution in correlation with other red cell parameters and the patient's clinical condition.    RDW 13.1 11.0 - 15.0 %   Platelets 300 140 - 400 Thousand/uL   MPV 11.7 7.5 - 12.5 fL   Neutro Abs 6,918 1,500 - 7,800 cells/uL   Absolute Lymphocytes 1,619 850 - 3,900 cells/uL   Absolute Monocytes 469 200 - 950 cells/uL   Eosinophils Absolute 166 15 - 500 cells/uL   Basophils Absolute 28 0 - 200 cells/uL   Neutrophils Relative % 75.2 %   Total Lymphocyte 17.6 %   Monocytes Relative 5.1 %   Eosinophils Relative 1.8 %   Basophils Relative 0.3 %  Comprehensive metabolic panel with GFR     Status: None   Collection Time: 03/09/24  2:58 PM  Result Value Ref Range   Glucose, Bld 99 65 - 99 mg/dL    Comment: .            Fasting reference interval .    BUN 12 7 - 25 mg/dL   Creat 9.42 9.49 - 9.02 mg/dL   eGFR 880 > OR = 60 fO/fpw/8.26f7   BUN/Creatinine Ratio SEE NOTE: 6 - 22 (calc)    Comment:    Not Reported: BUN and Creatinine are within    reference range. .    Sodium 139 135 - 146 mmol/L   Potassium 3.9  3.5 - 5.3 mmol/L   Chloride 106 98 - 110 mmol/L   CO2 26 20 - 32 mmol/L   Calcium  9.0 8.6 - 10.2 mg/dL   Total Protein 6.7 6.1 - 8.1 g/dL   Albumin 3.9 3.6 - 5.1 g/dL   Globulin 2.8 1.9 - 3.7 g/dL (calc)   AG Ratio 1.4 1.0 - 2.5 (calc)   Total Bilirubin 0.4 0.2 - 1.2 mg/dL   Alkaline phosphatase (APISO) 58 31 - 125 U/L   AST 15 10 - 30 U/L   ALT 19 6 - 29 U/L  Lipid panel     Status: Abnormal   Collection Time: 03/09/24  2:58 PM  Result Value Ref Range   Cholesterol 218 (H) <200 mg/dL   HDL 52 > OR = 50 mg/dL   Triglycerides 742 (H) <150 mg/dL    Comment: . If a non-fasting specimen was collected, consider repeat triglyceride testing on a fasting specimen if clinically indicated.  Veatrice et al. J. of Clin. Lipidol. 2015;9:129-169. SABRA    LDL Cholesterol (Calc) 126 (H) mg/dL (calc)    Comment: Reference range: <100 . Desirable range <100 mg/dL for primary prevention;   <70 mg/dL for patients with CHD or diabetic patients  with > or = 2 CHD risk factors. SABRA LDL-C is now calculated using the Martin-Hopkins  calculation, which is a validated novel method providing  better accuracy than the Friedewald equation in the  estimation of LDL-C.  Gladis APPLETHWAITE et al. SANDREA. 7986;689(80): 2061-2068  (http://education.[facility].com/faq/FAQ164)    Total CHOL/HDL Ratio 4.2 <5.0 (calc)   Non-HDL Cholesterol (Calc) 166 (H) <130 mg/dL (calc)    Comment: For patients with diabetes plus 1 major ASCVD risk  factor, treating to a non-HDL-C goal of <100 mg/dL  (LDL-C of <29 mg/dL) is considered a therapeutic  option.   Hemoglobin A1c  Status: None   Collection Time: 03/09/24  2:58 PM  Result Value Ref Range   Hgb A1c MFr Bld 5.2 <5.7 %    Comment: For the purpose of screening for the presence of diabetes: . <5.7%       Consistent with the absence of diabetes 5.7-6.4%    Consistent with increased risk for diabetes             (prediabetes) > or =6.5%  Consistent with  diabetes . This assay result is consistent with a decreased risk of diabetes. . Currently, no consensus exists regarding use of hemoglobin A1c for diagnosis of diabetes in children. . According to American Diabetes Association (ADA) guidelines, hemoglobin A1c <7.0% represents optimal control in non-pregnant diabetic patients. Different metrics may apply to specific patient populations.  Standards of Medical Care in Diabetes(ADA). .    Mean Plasma Glucose 103 mg/dL   eAG (mmol/L) 5.7 mmol/L  TSH     Status: None   Collection Time: 03/09/24  2:58 PM  Result Value Ref Range   TSH 2.24 mIU/L    Comment:           Reference Range .           > or = 20 Years  0.40-4.50 .                Pregnancy Ranges           First trimester    0.26-2.66           Second trimester   0.55-2.73           Third trimester    0.43-2.91       Fall Risk:    05/04/2024   10:00 AM 03/09/2024    2:21 PM 06/02/2023    4:05 PM 02/16/2023    1:36 PM 03/10/2019    3:58 PM  Fall Risk   Falls in the past year? 0 0 0 0 0   Number falls in past yr: 0 0  0   Injury with Fall? 0 0 0 0   Risk for fall due to : No Fall Risks  No Fall Risks No Fall Risks   Follow up Falls prevention discussed  Falls evaluation completed Falls evaluation completed Falls evaluation completed      Data saved with a previous flowsheet row definition     Functional Status Survey: Is the patient deaf or have difficulty hearing?: No Does the patient have difficulty seeing, even when wearing glasses/contacts?: No Does the patient have difficulty concentrating, remembering, or making decisions?: No Does the patient have difficulty walking or climbing stairs?: No Does the patient have difficulty dressing or bathing?: No Does the patient have difficulty doing errands alone such as visiting a doctor's office or shopping?: No   Assessment & Plan  Problem List Items Addressed This Visit   None Visit Diagnoses       Annual  physical exam    -  Primary   Relevant Orders   Cytology - PAP     Screening for cervical cancer       Relevant Orders   Cytology - PAP      Assessment and Plan Assessment & Plan Annual exam -up to date on labs -pap performed  Abnormal Pap smear history Recurrent abnormal Pap smears requiring follow-up procedures. Current Pap smear performed during this visit. - Perform Pap smear - Await Pap smear results and review on MyChart  Urinary stress incontinence  Urinary stress incontinence associated with activities such as sneezing and coughing. - Encourage regular Kegel exercises, especially during traffic lights  Obesity Obesity with recent weight loss from 270 lbs in June to 259 lbs currently. Engages in physical activity through daily yard work. Reports decreased appetite.  Insomnia Difficulty sleeping with recent episodes of only 2-5 hours of sleep per night. Tried melatonin without success. - Try magnesium glycinate for sleep    -USPSTF grade A and B recommendations reviewed with patient; age-appropriate recommendations, preventive care, screening tests, etc discussed and encouraged; healthy living encouraged; see AVS for patient education given to patient -Discussed importance of 150 minutes of physical activity weekly, eat two servings of fish weekly, eat one serving of tree nuts ( cashews, pistachios, pecans, almonds.SABRA) every other day, eat 6 servings of fruit/vegetables daily and drink plenty of water and avoid sweet beverages.   -Reviewed Health Maintenance: yes

## 2024-05-10 LAB — CYTOLOGY - PAP
Chlamydia: NEGATIVE
Comment: NEGATIVE
Comment: NEGATIVE
Comment: NORMAL
Diagnosis: NEGATIVE
High risk HPV: NEGATIVE
Neisseria Gonorrhea: NEGATIVE

## 2024-05-11 ENCOUNTER — Ambulatory Visit: Payer: Self-pay | Admitting: Nurse Practitioner
